# Patient Record
Sex: Female | Born: 1935 | Race: White | Hispanic: No | Smoking: Never smoker
Health system: Southern US, Community
[De-identification: ages and names within clinical notes are randomized; demographics above are authoritative.]

## PROBLEM LIST (undated history)

## (undated) DIAGNOSIS — K219 Gastro-esophageal reflux disease without esophagitis: Secondary | ICD-10-CM

## (undated) DIAGNOSIS — I509 Heart failure, unspecified: Secondary | ICD-10-CM

## (undated) DIAGNOSIS — Z8719 Personal history of other diseases of the digestive system: Secondary | ICD-10-CM

## (undated) DIAGNOSIS — C4490 Unspecified malignant neoplasm of skin, unspecified: Secondary | ICD-10-CM

## (undated) DIAGNOSIS — I10 Essential (primary) hypertension: Secondary | ICD-10-CM

## (undated) DIAGNOSIS — R011 Cardiac murmur, unspecified: Secondary | ICD-10-CM

## (undated) DIAGNOSIS — N184 Chronic kidney disease, stage 4 (severe): Secondary | ICD-10-CM

## (undated) DIAGNOSIS — M109 Gout, unspecified: Secondary | ICD-10-CM

## (undated) DIAGNOSIS — I4891 Unspecified atrial fibrillation: Secondary | ICD-10-CM

## (undated) DIAGNOSIS — E7800 Pure hypercholesterolemia, unspecified: Secondary | ICD-10-CM

## (undated) DIAGNOSIS — M1990 Unspecified osteoarthritis, unspecified site: Secondary | ICD-10-CM

## (undated) DIAGNOSIS — IMO0001 Reserved for inherently not codable concepts without codable children: Secondary | ICD-10-CM

## (undated) HISTORY — PX: BREAST LUMPECTOMY: SHX2

## (undated) HISTORY — DX: Unspecified atrial fibrillation: I48.91

## (undated) HISTORY — PX: TOTAL KNEE ARTHROPLASTY: SHX125

## (undated) HISTORY — PX: CATARACT EXTRACTION W/ INTRAOCULAR LENS  IMPLANT, BILATERAL: SHX1307

## (undated) HISTORY — DX: Essential (primary) hypertension: I10

## (undated) HISTORY — PX: JOINT REPLACEMENT: SHX530

---

## 1998-03-06 ENCOUNTER — Other Ambulatory Visit: Admission: RE | Admit: 1998-03-06 | Discharge: 1998-03-06 | Payer: Self-pay | Admitting: Internal Medicine

## 2000-01-21 ENCOUNTER — Encounter: Payer: Self-pay | Admitting: Family Medicine

## 2000-01-21 ENCOUNTER — Encounter: Admission: RE | Admit: 2000-01-21 | Discharge: 2000-01-21 | Payer: Self-pay | Admitting: Family Medicine

## 2001-04-14 ENCOUNTER — Encounter: Payer: Self-pay | Admitting: Family Medicine

## 2001-04-14 ENCOUNTER — Encounter: Admission: RE | Admit: 2001-04-14 | Discharge: 2001-04-14 | Payer: Self-pay | Admitting: Family Medicine

## 2003-11-12 ENCOUNTER — Encounter: Admission: RE | Admit: 2003-11-12 | Discharge: 2003-11-12 | Payer: Self-pay | Admitting: Cardiology

## 2003-12-11 ENCOUNTER — Ambulatory Visit (HOSPITAL_COMMUNITY): Admission: RE | Admit: 2003-12-11 | Discharge: 2003-12-11 | Payer: Self-pay | Admitting: Gastroenterology

## 2005-05-26 ENCOUNTER — Encounter
Admission: RE | Admit: 2005-05-26 | Discharge: 2005-08-24 | Payer: Self-pay | Admitting: Physical Medicine and Rehabilitation

## 2005-05-26 ENCOUNTER — Ambulatory Visit: Payer: Self-pay | Admitting: Physical Medicine and Rehabilitation

## 2005-05-27 ENCOUNTER — Ambulatory Visit (HOSPITAL_COMMUNITY)
Admission: RE | Admit: 2005-05-27 | Discharge: 2005-05-27 | Payer: Self-pay | Admitting: Physical Medicine and Rehabilitation

## 2005-06-08 ENCOUNTER — Encounter
Admission: RE | Admit: 2005-06-08 | Discharge: 2005-07-16 | Payer: Self-pay | Admitting: Physical Medicine and Rehabilitation

## 2005-07-02 ENCOUNTER — Encounter
Admission: RE | Admit: 2005-07-02 | Discharge: 2005-07-02 | Payer: Self-pay | Admitting: Physical Medicine and Rehabilitation

## 2005-07-21 ENCOUNTER — Ambulatory Visit: Payer: Self-pay | Admitting: Physical Medicine and Rehabilitation

## 2005-07-28 ENCOUNTER — Encounter (HOSPITAL_COMMUNITY): Admission: RE | Admit: 2005-07-28 | Discharge: 2005-10-26 | Payer: Self-pay | Admitting: Endocrinology

## 2005-09-01 ENCOUNTER — Encounter (HOSPITAL_COMMUNITY): Admission: RE | Admit: 2005-09-01 | Discharge: 2005-09-01 | Payer: Self-pay | Admitting: Endocrinology

## 2005-09-15 ENCOUNTER — Encounter
Admission: RE | Admit: 2005-09-15 | Discharge: 2005-12-14 | Payer: Self-pay | Admitting: Physical Medicine and Rehabilitation

## 2005-09-15 ENCOUNTER — Ambulatory Visit: Payer: Self-pay | Admitting: Physical Medicine and Rehabilitation

## 2006-01-21 ENCOUNTER — Ambulatory Visit: Payer: Self-pay | Admitting: Physical Medicine and Rehabilitation

## 2006-01-21 ENCOUNTER — Encounter
Admission: RE | Admit: 2006-01-21 | Discharge: 2006-04-21 | Payer: Self-pay | Admitting: Physical Medicine and Rehabilitation

## 2006-03-03 ENCOUNTER — Ambulatory Visit: Payer: Self-pay | Admitting: Physical Medicine and Rehabilitation

## 2006-04-13 ENCOUNTER — Ambulatory Visit: Payer: Self-pay | Admitting: Physical Medicine and Rehabilitation

## 2006-05-11 ENCOUNTER — Encounter
Admission: RE | Admit: 2006-05-11 | Discharge: 2006-08-09 | Payer: Self-pay | Admitting: Physical Medicine and Rehabilitation

## 2006-06-08 ENCOUNTER — Ambulatory Visit: Payer: Self-pay | Admitting: Physical Medicine and Rehabilitation

## 2006-08-03 ENCOUNTER — Ambulatory Visit: Payer: Self-pay | Admitting: Physical Medicine and Rehabilitation

## 2006-08-24 ENCOUNTER — Encounter
Admission: RE | Admit: 2006-08-24 | Discharge: 2006-11-22 | Payer: Self-pay | Admitting: Physical Medicine and Rehabilitation

## 2006-09-29 ENCOUNTER — Ambulatory Visit: Payer: Self-pay | Admitting: Physical Medicine and Rehabilitation

## 2006-12-01 ENCOUNTER — Ambulatory Visit: Payer: Self-pay | Admitting: Physical Medicine and Rehabilitation

## 2006-12-01 ENCOUNTER — Encounter
Admission: RE | Admit: 2006-12-01 | Discharge: 2007-03-01 | Payer: Self-pay | Admitting: Physical Medicine and Rehabilitation

## 2007-01-04 ENCOUNTER — Ambulatory Visit: Payer: Self-pay | Admitting: Physical Medicine and Rehabilitation

## 2007-02-03 ENCOUNTER — Ambulatory Visit: Payer: Self-pay | Admitting: Physical Medicine and Rehabilitation

## 2007-03-03 ENCOUNTER — Encounter
Admission: RE | Admit: 2007-03-03 | Discharge: 2007-06-01 | Payer: Self-pay | Admitting: Physical Medicine and Rehabilitation

## 2007-04-06 ENCOUNTER — Ambulatory Visit: Payer: Self-pay | Admitting: Physical Medicine and Rehabilitation

## 2007-05-05 ENCOUNTER — Encounter
Admission: RE | Admit: 2007-05-05 | Discharge: 2007-08-03 | Payer: Self-pay | Admitting: Physical Medicine and Rehabilitation

## 2007-06-02 ENCOUNTER — Ambulatory Visit: Payer: Self-pay | Admitting: Physical Medicine and Rehabilitation

## 2007-07-04 ENCOUNTER — Ambulatory Visit: Payer: Self-pay | Admitting: Physical Medicine and Rehabilitation

## 2007-07-19 IMAGING — CT CT CHEST WO
2 of 4 series · 16 of 31 positions shown, 18 images · non-contrast
Comparison: none

______________________________________________________________

REASON FOR CONSULTATION: R/O BLEED
____________________________________________
EXAM: HEAD WITHOUT CONTRAST
NONENHANCED HEAD CT:
No intra-axial or extra-axial pathologic fluid or blood collections are
identified. Infarction is noted in the LEFT basal ganglia and anterior
limb of the internal capsule. No evidence of intracranial hemorrhage.

[Series 2: chest wo · axial · 0.79mm/px · z∈[+1252,+1477]mm · 8 of 61 slices shown, 10 images]
[im 8/61  mediastinal]
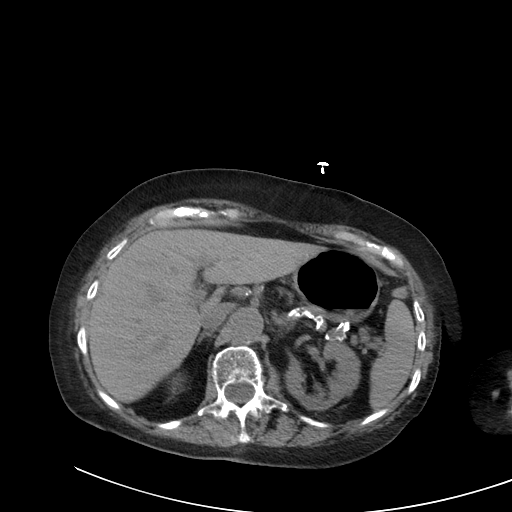
[im 8/61  lung]
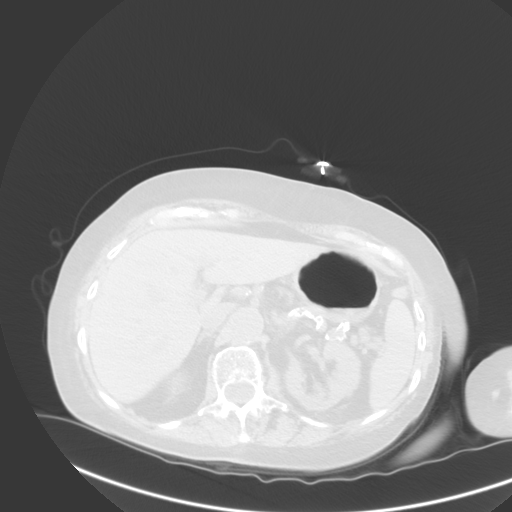
[im 16/61  lung]
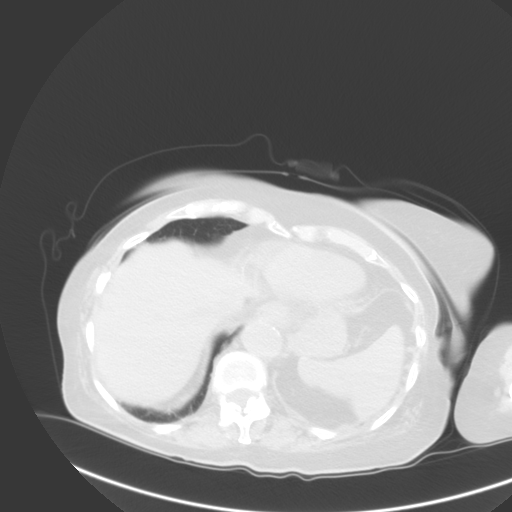
[im 23/61  lung]
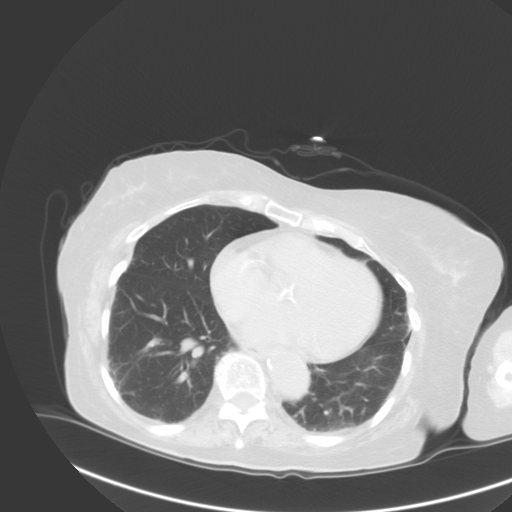
[im 29/61  lung]
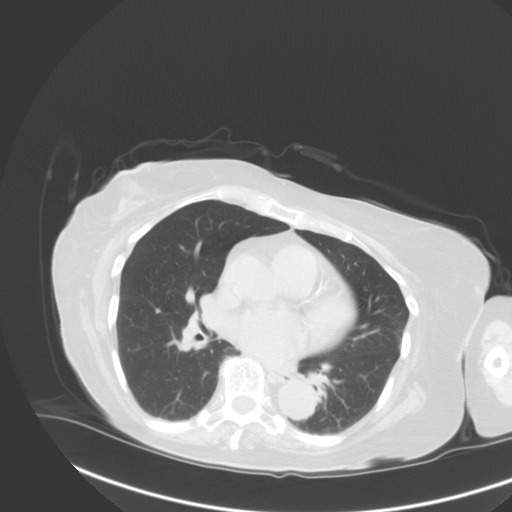
[im 31/61  mediastinal]
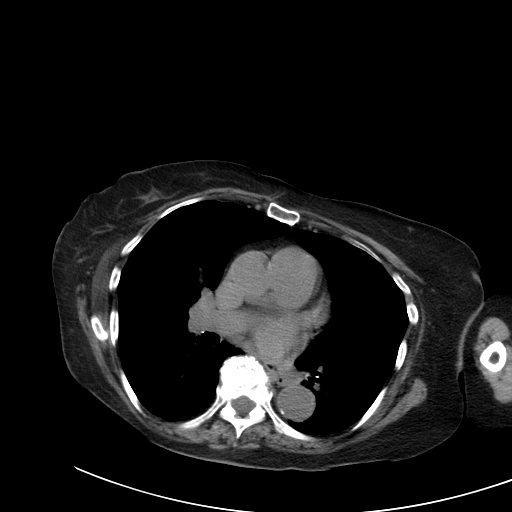
[im 31/61  lung]
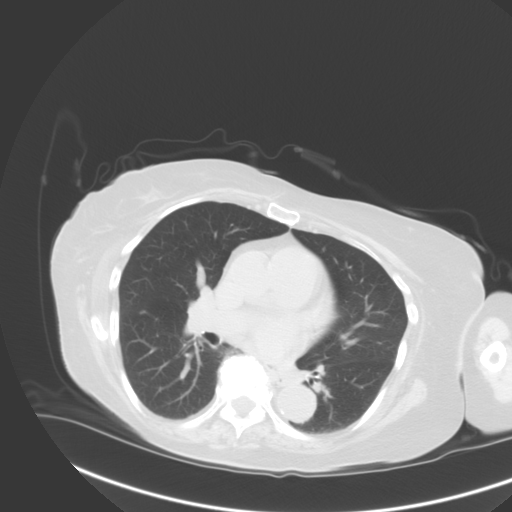
[im 38/61  lung]
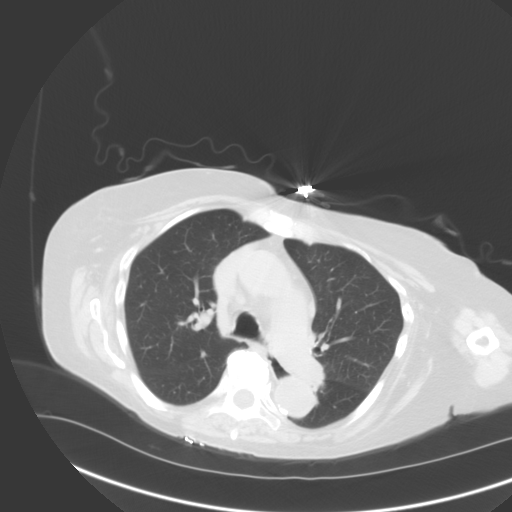
[im 46/61  lung]
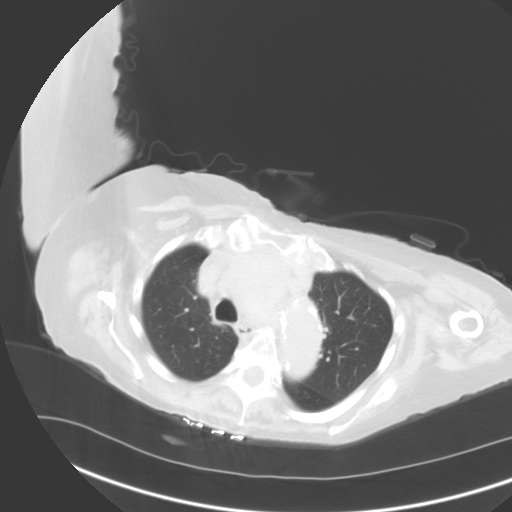
[im 53/61  lung]
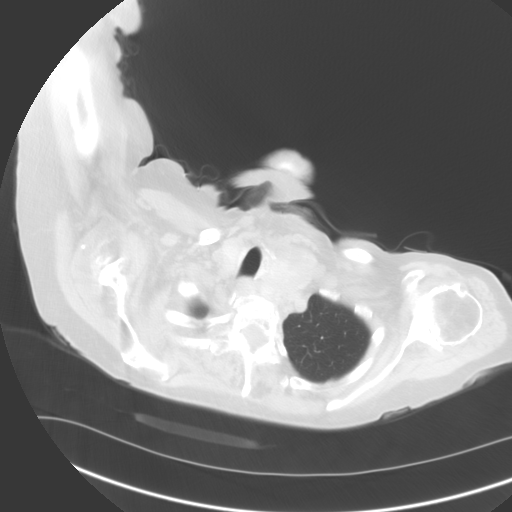

[Series 5: bone · axial · 0.79mm/px · z∈[+1252,+1477]mm · 8 of 61 slices shown]
[im 8/61  lung]
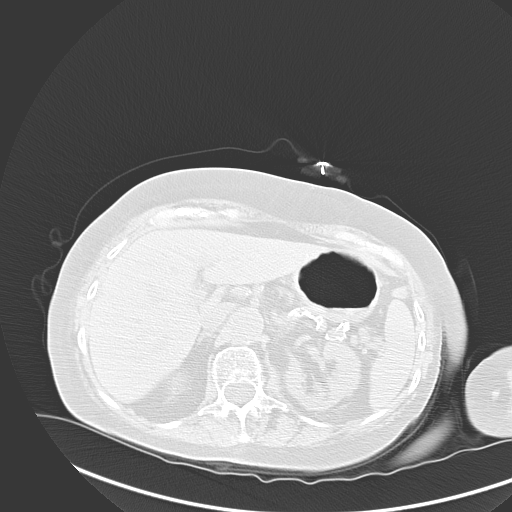
[im 16/61  lung]
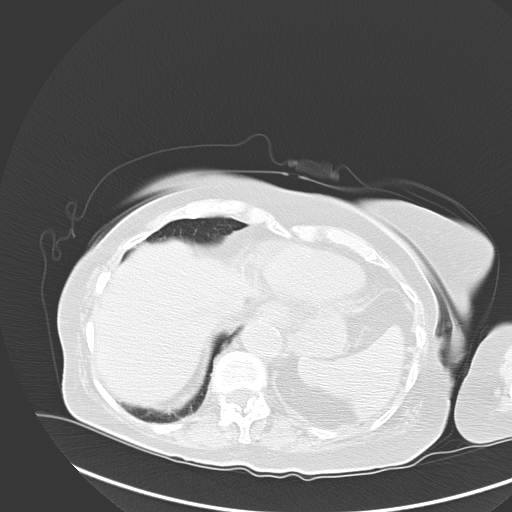
[im 23/61  lung]
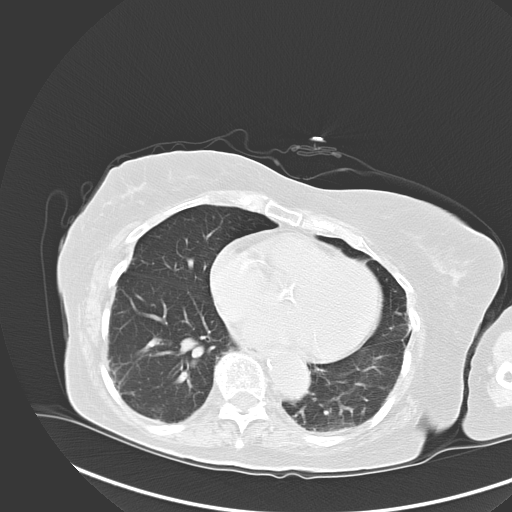
[im 29/61  lung]
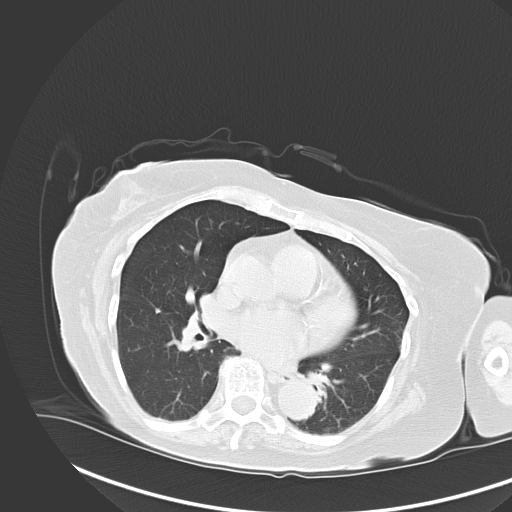
[im 31/61  lung]
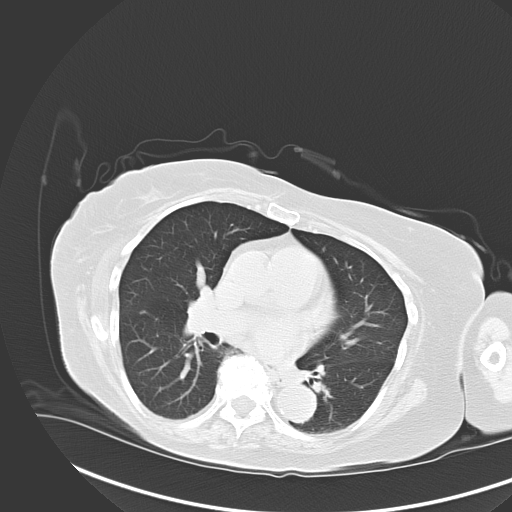
[im 38/61  lung]
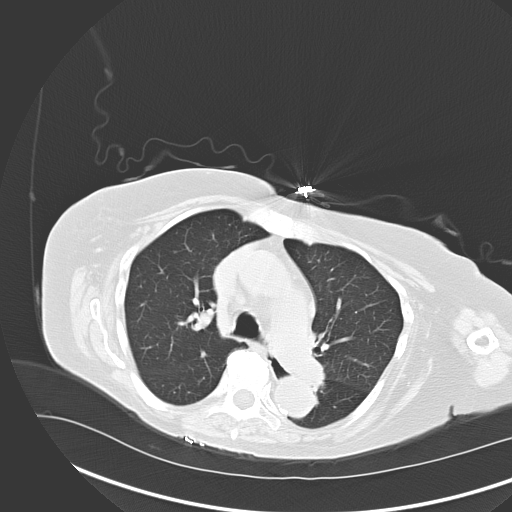
[im 46/61  lung]
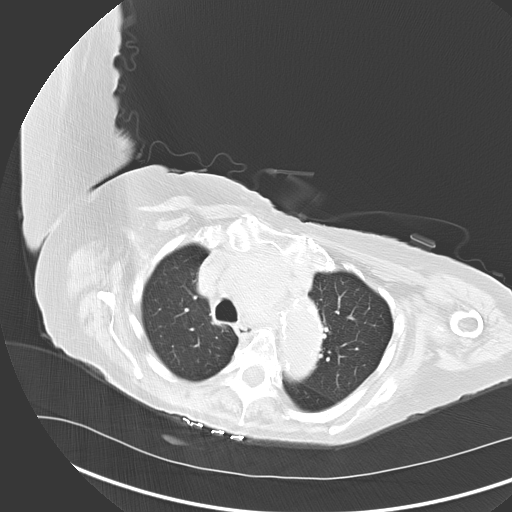
[im 53/61  lung]
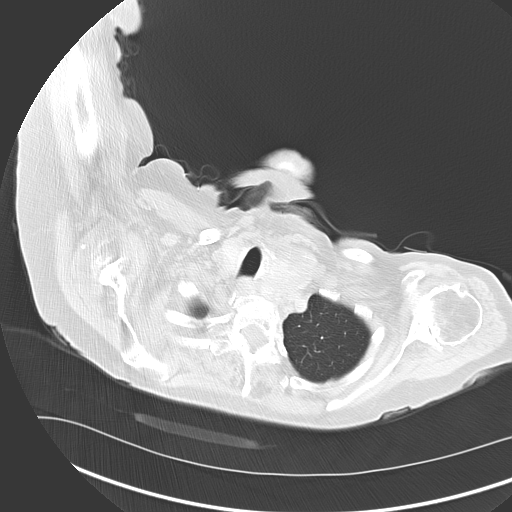

[16 of 31 positions shown; findings below may reference images not displayed]

IMPRESSION: Lacunar infarctions in the internal capsule and LEFT basal
ganglia. No evidence of acute hemorrhage.

## 2007-08-02 ENCOUNTER — Encounter
Admission: RE | Admit: 2007-08-02 | Discharge: 2007-10-31 | Payer: Self-pay | Admitting: Physical Medicine and Rehabilitation

## 2007-08-05 ENCOUNTER — Ambulatory Visit: Payer: Self-pay | Admitting: Physical Medicine and Rehabilitation

## 2007-08-29 ENCOUNTER — Ambulatory Visit: Payer: Self-pay | Admitting: Physical Medicine & Rehabilitation

## 2007-08-29 ENCOUNTER — Encounter
Admission: RE | Admit: 2007-08-29 | Discharge: 2007-08-29 | Payer: Self-pay | Admitting: Physical Medicine & Rehabilitation

## 2007-09-09 ENCOUNTER — Ambulatory Visit: Payer: Self-pay | Admitting: Physical Medicine and Rehabilitation

## 2007-10-10 ENCOUNTER — Ambulatory Visit: Payer: Self-pay | Admitting: Physical Medicine and Rehabilitation

## 2007-10-14 ENCOUNTER — Ambulatory Visit: Payer: Self-pay | Admitting: Physical Medicine and Rehabilitation

## 2007-10-21 ENCOUNTER — Ambulatory Visit: Payer: Self-pay | Admitting: Physical Medicine and Rehabilitation

## 2007-11-22 ENCOUNTER — Encounter
Admission: RE | Admit: 2007-11-22 | Discharge: 2008-02-20 | Payer: Self-pay | Admitting: Physical Medicine and Rehabilitation

## 2007-11-23 ENCOUNTER — Ambulatory Visit: Payer: Self-pay | Admitting: Physical Medicine and Rehabilitation

## 2007-12-07 ENCOUNTER — Ambulatory Visit: Payer: Self-pay | Admitting: Physical Medicine and Rehabilitation

## 2007-12-21 ENCOUNTER — Ambulatory Visit: Payer: Self-pay | Admitting: Physical Medicine and Rehabilitation

## 2008-01-18 ENCOUNTER — Ambulatory Visit: Payer: Self-pay | Admitting: Physical Medicine and Rehabilitation

## 2008-02-15 ENCOUNTER — Ambulatory Visit: Payer: Self-pay | Admitting: Physical Medicine and Rehabilitation

## 2008-02-17 ENCOUNTER — Ambulatory Visit (HOSPITAL_COMMUNITY)
Admission: RE | Admit: 2008-02-17 | Discharge: 2008-02-17 | Payer: Self-pay | Admitting: Physical Medicine and Rehabilitation

## 2008-03-05 ENCOUNTER — Encounter
Admission: RE | Admit: 2008-03-05 | Discharge: 2008-06-03 | Payer: Self-pay | Admitting: Physical Medicine and Rehabilitation

## 2008-03-07 ENCOUNTER — Ambulatory Visit: Payer: Self-pay | Admitting: Physical Medicine and Rehabilitation

## 2008-04-11 ENCOUNTER — Ambulatory Visit: Payer: Self-pay | Admitting: Physical Medicine and Rehabilitation

## 2008-05-21 ENCOUNTER — Ambulatory Visit: Payer: Self-pay | Admitting: Physical Medicine and Rehabilitation

## 2008-06-14 ENCOUNTER — Encounter
Admission: RE | Admit: 2008-06-14 | Discharge: 2008-09-12 | Payer: Self-pay | Admitting: Physical Medicine and Rehabilitation

## 2008-06-18 ENCOUNTER — Ambulatory Visit: Payer: Self-pay | Admitting: Physical Medicine and Rehabilitation

## 2008-06-19 ENCOUNTER — Ambulatory Visit (HOSPITAL_COMMUNITY)
Admission: RE | Admit: 2008-06-19 | Discharge: 2008-06-19 | Payer: Self-pay | Admitting: Physical Medicine and Rehabilitation

## 2008-07-20 ENCOUNTER — Ambulatory Visit: Payer: Self-pay | Admitting: Physical Medicine and Rehabilitation

## 2008-08-24 ENCOUNTER — Ambulatory Visit: Payer: Self-pay | Admitting: Physical Medicine and Rehabilitation

## 2008-09-06 ENCOUNTER — Encounter
Admission: RE | Admit: 2008-09-06 | Discharge: 2008-12-05 | Payer: Self-pay | Admitting: Physical Medicine and Rehabilitation

## 2008-09-21 ENCOUNTER — Ambulatory Visit: Payer: Self-pay | Admitting: Physical Medicine and Rehabilitation

## 2008-10-22 ENCOUNTER — Ambulatory Visit: Payer: Self-pay | Admitting: Physical Medicine and Rehabilitation

## 2008-11-30 ENCOUNTER — Ambulatory Visit: Payer: Self-pay | Admitting: Physical Medicine and Rehabilitation

## 2008-12-26 ENCOUNTER — Encounter
Admission: RE | Admit: 2008-12-26 | Discharge: 2009-03-21 | Payer: Self-pay | Admitting: Physical Medicine and Rehabilitation

## 2008-12-28 ENCOUNTER — Ambulatory Visit: Payer: Self-pay | Admitting: Physical Medicine and Rehabilitation

## 2009-01-25 ENCOUNTER — Ambulatory Visit: Payer: Self-pay | Admitting: Physical Medicine and Rehabilitation

## 2009-02-25 ENCOUNTER — Ambulatory Visit: Payer: Self-pay | Admitting: Physical Medicine and Rehabilitation

## 2009-04-03 ENCOUNTER — Encounter
Admission: RE | Admit: 2009-04-03 | Discharge: 2009-07-02 | Payer: Self-pay | Admitting: Physical Medicine and Rehabilitation

## 2009-04-05 ENCOUNTER — Ambulatory Visit: Payer: Self-pay | Admitting: Physical Medicine and Rehabilitation

## 2009-05-03 ENCOUNTER — Ambulatory Visit: Payer: Self-pay | Admitting: Physical Medicine and Rehabilitation

## 2009-05-29 ENCOUNTER — Ambulatory Visit: Payer: Self-pay | Admitting: Physical Medicine and Rehabilitation

## 2009-06-26 ENCOUNTER — Ambulatory Visit: Payer: Self-pay | Admitting: Physical Medicine and Rehabilitation

## 2009-07-05 ENCOUNTER — Emergency Department (HOSPITAL_COMMUNITY): Admission: EM | Admit: 2009-07-05 | Discharge: 2009-07-05 | Payer: Self-pay | Admitting: Emergency Medicine

## 2009-07-29 ENCOUNTER — Encounter
Admission: RE | Admit: 2009-07-29 | Discharge: 2009-10-27 | Payer: Self-pay | Source: Home / Self Care | Admitting: Physical Medicine and Rehabilitation

## 2009-07-31 ENCOUNTER — Ambulatory Visit: Payer: Self-pay | Admitting: Physical Medicine and Rehabilitation

## 2009-09-04 ENCOUNTER — Ambulatory Visit: Payer: Self-pay | Admitting: Physical Medicine and Rehabilitation

## 2009-10-14 ENCOUNTER — Ambulatory Visit: Payer: Self-pay | Admitting: Physical Medicine and Rehabilitation

## 2009-11-20 ENCOUNTER — Encounter
Admission: RE | Admit: 2009-11-20 | Discharge: 2010-02-18 | Payer: Self-pay | Admitting: Physical Medicine and Rehabilitation

## 2009-11-27 ENCOUNTER — Ambulatory Visit: Payer: Self-pay | Admitting: Physical Medicine and Rehabilitation

## 2009-12-30 ENCOUNTER — Ambulatory Visit: Payer: Self-pay | Admitting: Physical Medicine and Rehabilitation

## 2010-01-29 ENCOUNTER — Ambulatory Visit: Payer: Self-pay | Admitting: Physical Medicine and Rehabilitation

## 2010-02-19 ENCOUNTER — Encounter
Admission: RE | Admit: 2010-02-19 | Discharge: 2010-03-26 | Payer: Self-pay | Source: Home / Self Care | Attending: Physical Medicine and Rehabilitation | Admitting: Physical Medicine and Rehabilitation

## 2010-02-26 ENCOUNTER — Ambulatory Visit: Payer: Self-pay | Admitting: Physical Medicine and Rehabilitation

## 2010-03-21 IMAGING — CR DG HIP W/ PELVIS BILAT
5 series · 5 of 5 positions shown · non-contrast
Comparison: None.

CLINICAL DATA: Fell.  Bilateral hip pain.

BILATERAL HIP WITH PELVIS - 4+ VIEW 02/17/2008:

[t pelvis a.p.]
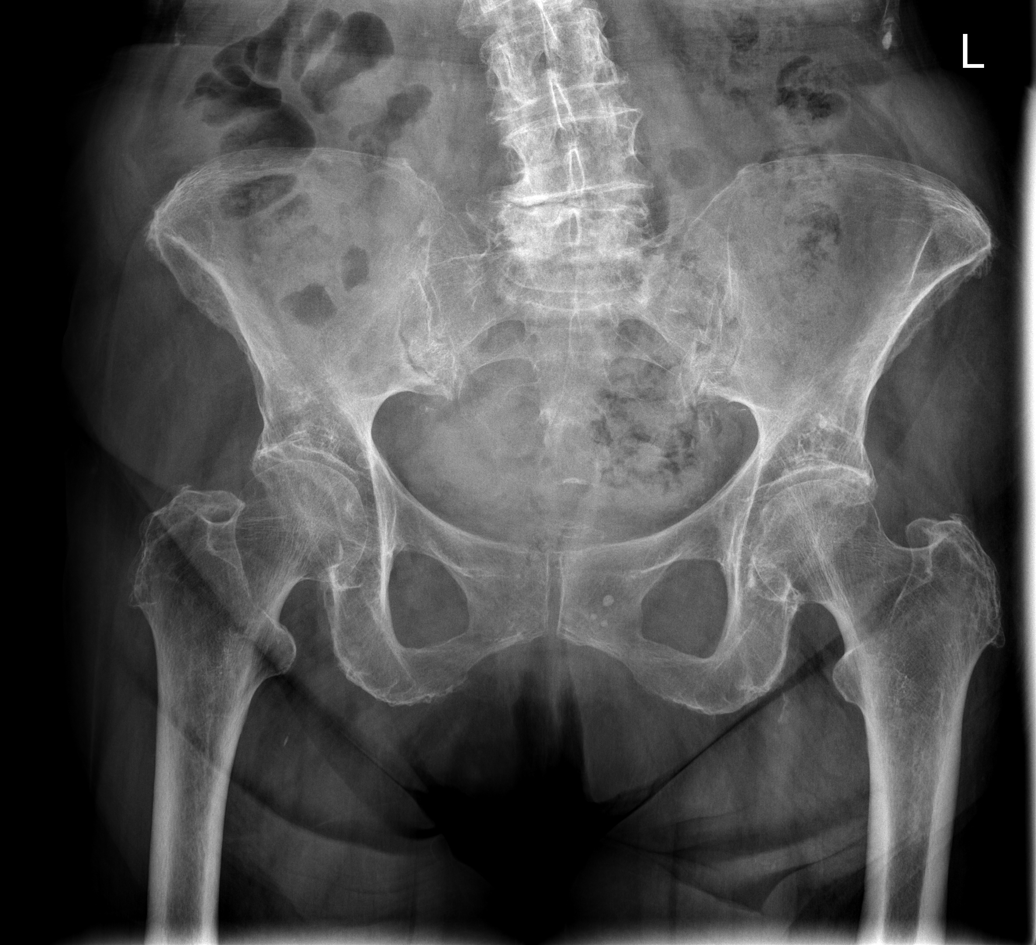

[t hip ap left]
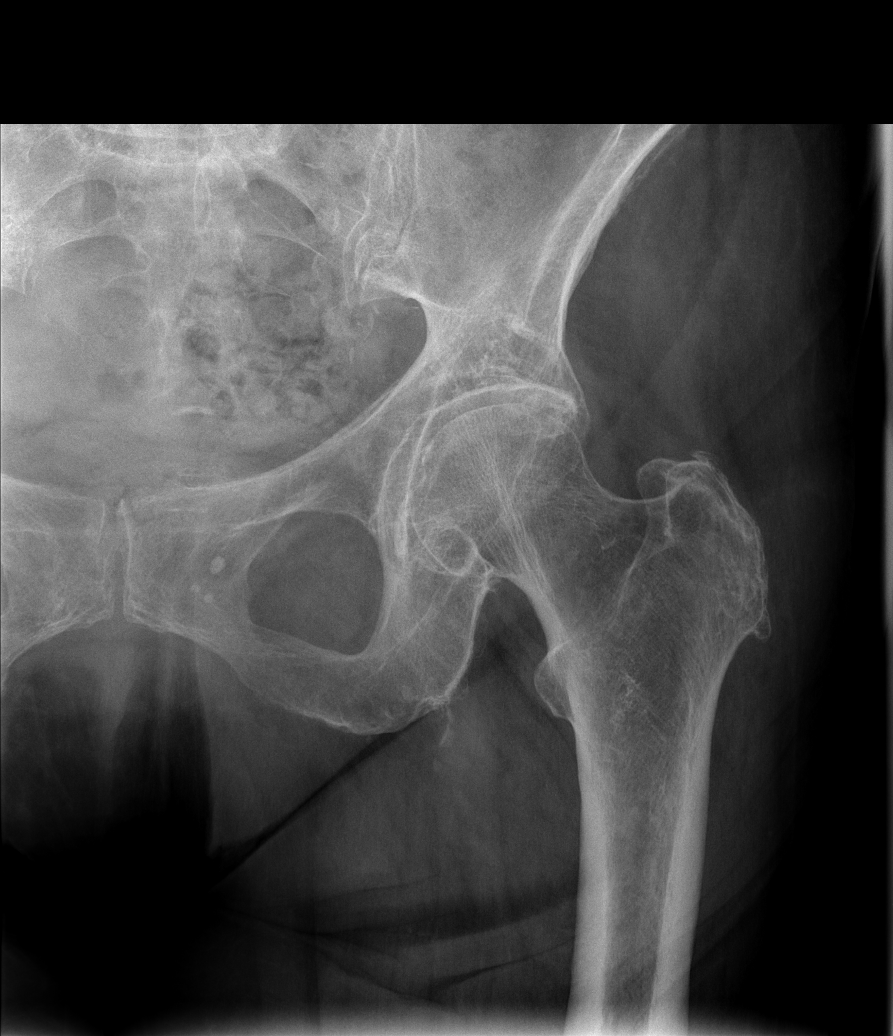

[t hip ap right]
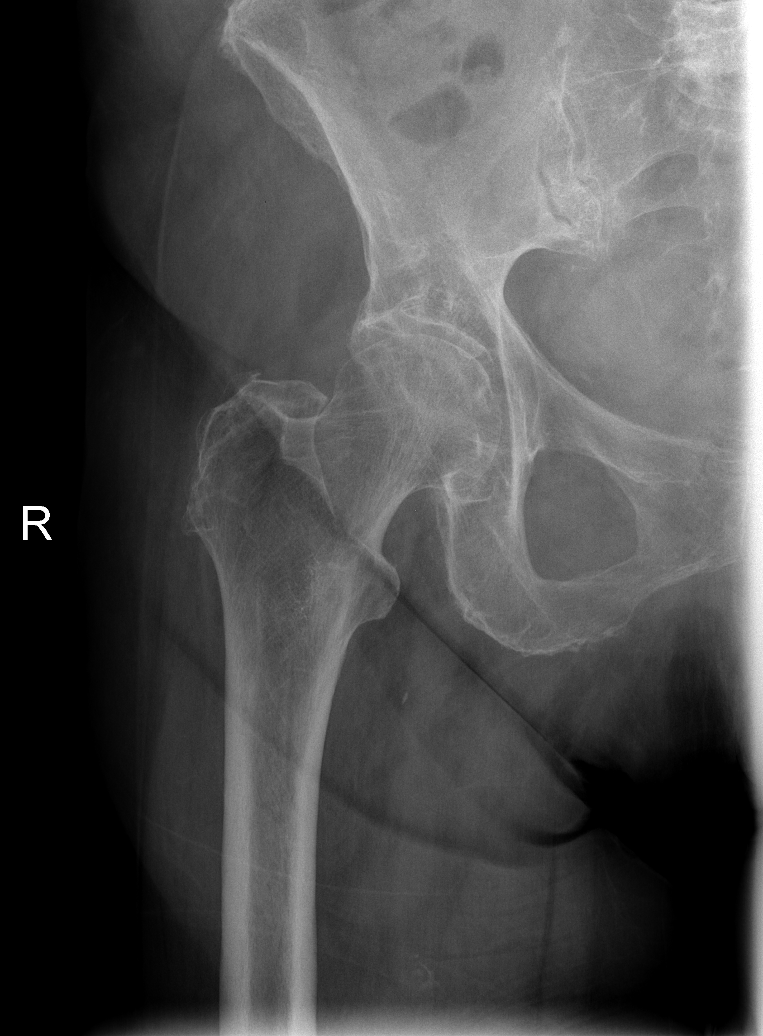

[t hip frog leg left]
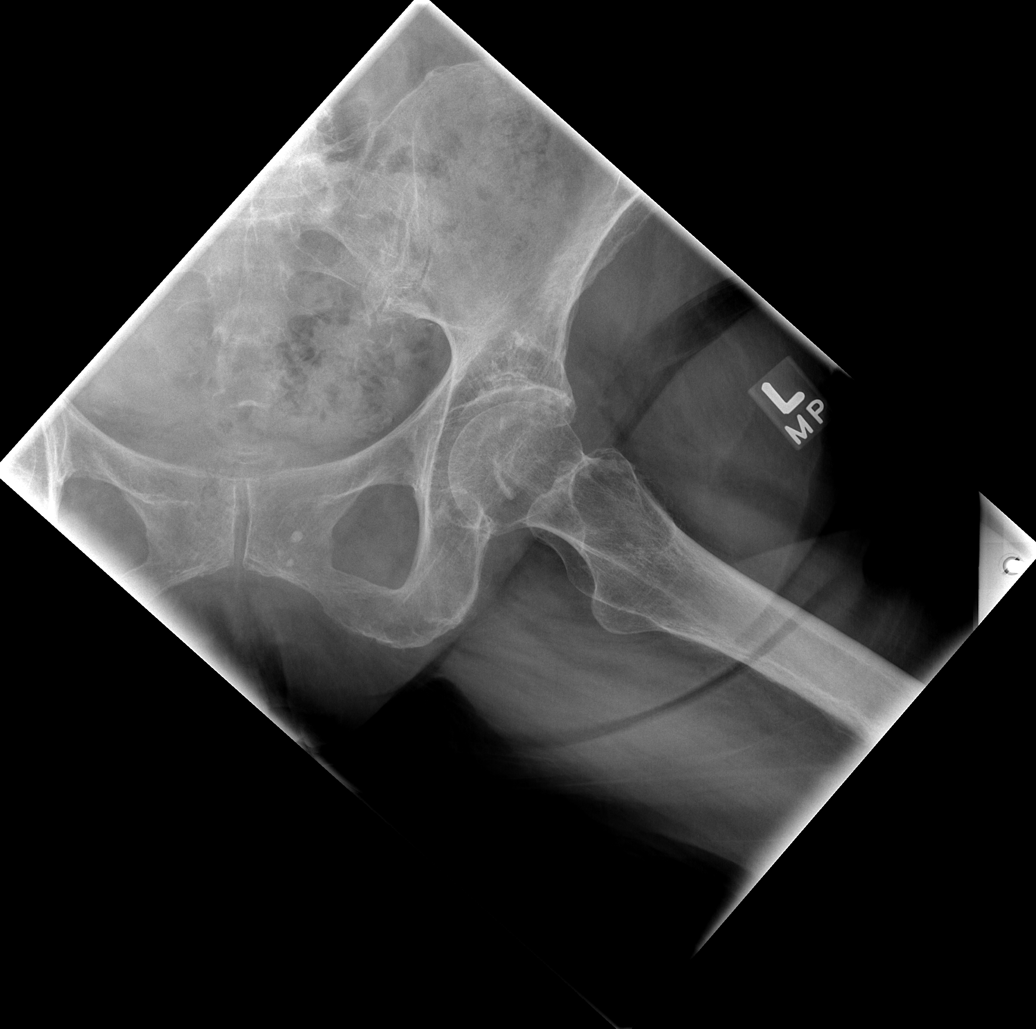

[t hip frog leg right]
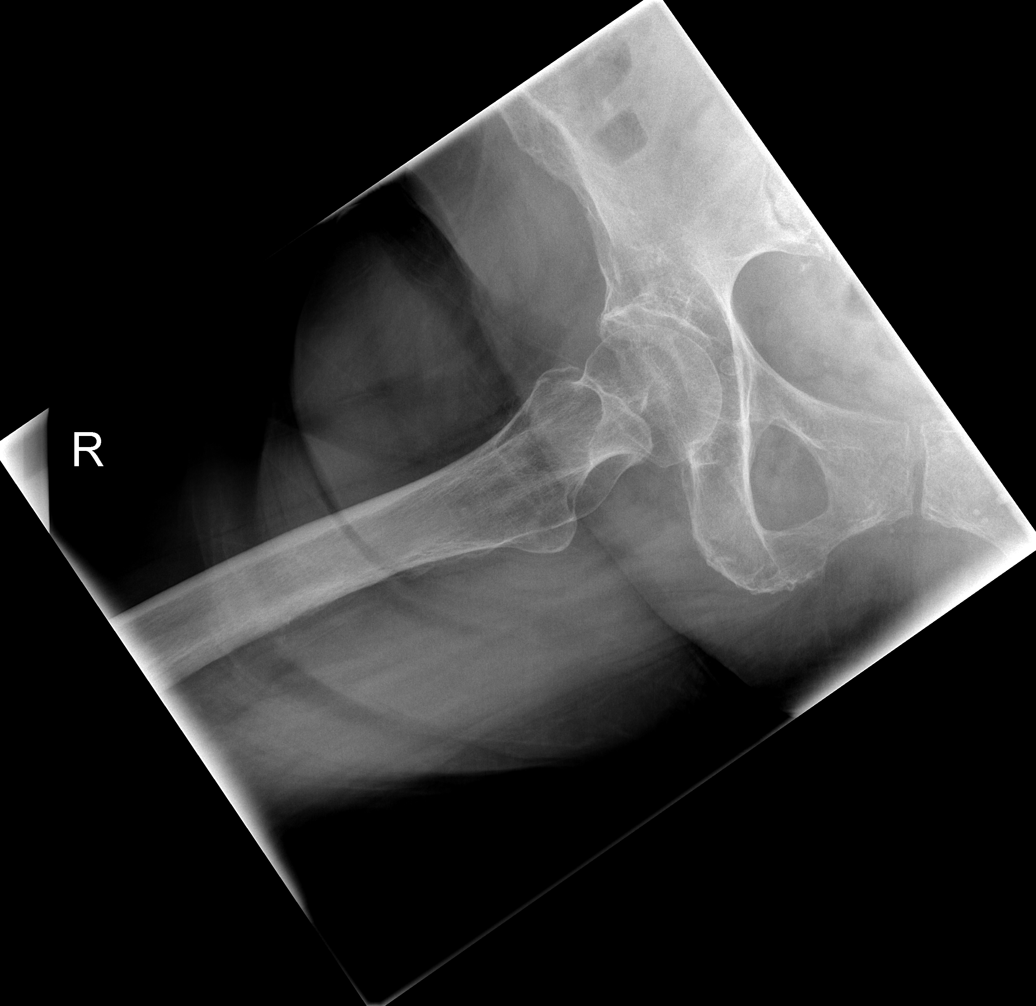

[5 of 5 positions shown; findings below may reference images not displayed]

FINDINGS: Mild osteopenia.  No evidence of acute fracture or
dislocation involving either hip.  Joint spaces well preserved for
age.  Mild enthesopathy at the musculotendinous insertions on the
greater trochanters bilaterally.  No significant intrinsic osseous
abnormality involving either hip.

Included AP pelvis demonstrating degenerative changes in the
sacroiliac joints and visualized lower lumbar spine.  Symphysis
pubis intact.
IMPRESSION: 1.  No acute skeletal abnormalities.
2.  Well-preserved joint spaces in both hips.
3.  Degenerative changes in the sacroiliac joints and visualized
lower lumbar spine.
4.  Mild osteopenia.

## 2010-03-31 ENCOUNTER — Encounter
Admission: RE | Admit: 2010-03-31 | Discharge: 2010-04-01 | Payer: Self-pay | Source: Home / Self Care | Attending: Physical Medicine and Rehabilitation | Admitting: Physical Medicine and Rehabilitation

## 2010-04-01 ENCOUNTER — Ambulatory Visit
Admission: RE | Admit: 2010-04-01 | Discharge: 2010-04-01 | Payer: Self-pay | Source: Home / Self Care | Attending: Physical Medicine and Rehabilitation | Admitting: Physical Medicine and Rehabilitation

## 2010-05-05 ENCOUNTER — Ambulatory Visit: Payer: Medicare Other | Admitting: Physical Medicine and Rehabilitation

## 2010-05-05 ENCOUNTER — Ambulatory Visit: Payer: Medicare Other | Attending: Physical Medicine and Rehabilitation

## 2010-05-05 DIAGNOSIS — M47812 Spondylosis without myelopathy or radiculopathy, cervical region: Secondary | ICD-10-CM | POA: Insufficient documentation

## 2010-05-05 DIAGNOSIS — M542 Cervicalgia: Secondary | ICD-10-CM

## 2010-05-05 DIAGNOSIS — M1710 Unilateral primary osteoarthritis, unspecified knee: Secondary | ICD-10-CM

## 2010-05-05 DIAGNOSIS — G8929 Other chronic pain: Secondary | ICD-10-CM | POA: Insufficient documentation

## 2010-05-05 DIAGNOSIS — M159 Polyosteoarthritis, unspecified: Secondary | ICD-10-CM | POA: Insufficient documentation

## 2010-05-05 DIAGNOSIS — M545 Low back pain, unspecified: Secondary | ICD-10-CM

## 2010-05-05 DIAGNOSIS — M48061 Spinal stenosis, lumbar region without neurogenic claudication: Secondary | ICD-10-CM | POA: Insufficient documentation

## 2010-05-05 DIAGNOSIS — M109 Gout, unspecified: Secondary | ICD-10-CM | POA: Insufficient documentation

## 2010-05-05 DIAGNOSIS — M19029 Primary osteoarthritis, unspecified elbow: Secondary | ICD-10-CM

## 2010-05-05 DIAGNOSIS — Z79899 Other long term (current) drug therapy: Secondary | ICD-10-CM | POA: Insufficient documentation

## 2010-05-05 DIAGNOSIS — M47817 Spondylosis without myelopathy or radiculopathy, lumbosacral region: Secondary | ICD-10-CM | POA: Insufficient documentation

## 2010-05-05 DIAGNOSIS — Z96659 Presence of unspecified artificial knee joint: Secondary | ICD-10-CM | POA: Insufficient documentation

## 2010-06-03 ENCOUNTER — Inpatient Hospital Stay (HOSPITAL_COMMUNITY)
Admission: EM | Admit: 2010-06-03 | Discharge: 2010-06-10 | DRG: 292 | Disposition: A | Discharge status: Home Health | Payer: Medicare Other | Attending: Cardiology | Admitting: Cardiology

## 2010-06-03 ENCOUNTER — Emergency Department (HOSPITAL_COMMUNITY): Payer: Medicare Other

## 2010-06-03 ENCOUNTER — Encounter: Payer: Medicare Other | Attending: Physical Medicine and Rehabilitation

## 2010-06-03 ENCOUNTER — Ambulatory Visit: Payer: Medicare Other

## 2010-06-03 DIAGNOSIS — M19019 Primary osteoarthritis, unspecified shoulder: Secondary | ICD-10-CM | POA: Insufficient documentation

## 2010-06-03 DIAGNOSIS — R1310 Dysphagia, unspecified: Secondary | ICD-10-CM | POA: Diagnosis present

## 2010-06-03 DIAGNOSIS — Z96659 Presence of unspecified artificial knee joint: Secondary | ICD-10-CM

## 2010-06-03 DIAGNOSIS — M51379 Other intervertebral disc degeneration, lumbosacral region without mention of lumbar back pain or lower extremity pain: Secondary | ICD-10-CM | POA: Diagnosis present

## 2010-06-03 DIAGNOSIS — M5030 Other cervical disc degeneration, unspecified cervical region: Secondary | ICD-10-CM | POA: Diagnosis present

## 2010-06-03 DIAGNOSIS — M545 Low back pain, unspecified: Secondary | ICD-10-CM | POA: Insufficient documentation

## 2010-06-03 DIAGNOSIS — I4891 Unspecified atrial fibrillation: Secondary | ICD-10-CM | POA: Diagnosis present

## 2010-06-03 DIAGNOSIS — M109 Gout, unspecified: Secondary | ICD-10-CM | POA: Insufficient documentation

## 2010-06-03 DIAGNOSIS — M25519 Pain in unspecified shoulder: Secondary | ICD-10-CM | POA: Insufficient documentation

## 2010-06-03 DIAGNOSIS — I110 Hypertensive heart disease with heart failure: Principal | ICD-10-CM | POA: Diagnosis present

## 2010-06-03 DIAGNOSIS — M5137 Other intervertebral disc degeneration, lumbosacral region: Secondary | ICD-10-CM | POA: Diagnosis present

## 2010-06-03 DIAGNOSIS — E860 Dehydration: Secondary | ICD-10-CM | POA: Diagnosis present

## 2010-06-03 DIAGNOSIS — M47812 Spondylosis without myelopathy or radiculopathy, cervical region: Secondary | ICD-10-CM | POA: Insufficient documentation

## 2010-06-03 DIAGNOSIS — K228 Other specified diseases of esophagus: Secondary | ICD-10-CM | POA: Diagnosis present

## 2010-06-03 DIAGNOSIS — K2289 Other specified disease of esophagus: Secondary | ICD-10-CM | POA: Diagnosis present

## 2010-06-03 DIAGNOSIS — M542 Cervicalgia: Secondary | ICD-10-CM

## 2010-06-03 DIAGNOSIS — N179 Acute kidney failure, unspecified: Secondary | ICD-10-CM | POA: Diagnosis present

## 2010-06-03 DIAGNOSIS — M1710 Unilateral primary osteoarthritis, unspecified knee: Secondary | ICD-10-CM | POA: Insufficient documentation

## 2010-06-03 DIAGNOSIS — G894 Chronic pain syndrome: Secondary | ICD-10-CM | POA: Diagnosis present

## 2010-06-03 DIAGNOSIS — G8929 Other chronic pain: Secondary | ICD-10-CM | POA: Insufficient documentation

## 2010-06-03 DIAGNOSIS — I5033 Acute on chronic diastolic (congestive) heart failure: Secondary | ICD-10-CM | POA: Diagnosis present

## 2010-06-03 DIAGNOSIS — M47817 Spondylosis without myelopathy or radiculopathy, lumbosacral region: Secondary | ICD-10-CM | POA: Insufficient documentation

## 2010-06-03 DIAGNOSIS — I43 Cardiomyopathy in diseases classified elsewhere: Secondary | ICD-10-CM | POA: Diagnosis present

## 2010-06-03 DIAGNOSIS — Z79899 Other long term (current) drug therapy: Secondary | ICD-10-CM | POA: Insufficient documentation

## 2010-06-03 DIAGNOSIS — M25529 Pain in unspecified elbow: Secondary | ICD-10-CM

## 2010-06-03 DIAGNOSIS — M25569 Pain in unspecified knee: Secondary | ICD-10-CM | POA: Insufficient documentation

## 2010-06-03 LAB — CBC
Hemoglobin: 10.7 g/dL — ABNORMAL LOW (ref 12.0–15.0)
MCH: 29.5 pg (ref 26.0–34.0)
MCHC: 32.7 g/dL (ref 30.0–36.0)
Platelets: 180 10*3/uL (ref 150–400)
WBC: 4.5 10*3/uL (ref 4.0–10.5)

## 2010-06-03 LAB — POCT CARDIAC MARKERS
CKMB, poc: 1.1 ng/mL (ref 1.0–8.0)
Myoglobin, poc: 135 ng/mL (ref 12–200)
Troponin i, poc: <0.05 ng/mL (ref 0.00–0.09)

## 2010-06-03 LAB — POCT I-STAT, CHEM 8
BUN: 26 mg/dL — ABNORMAL HIGH (ref 6–23)
Calcium, Ion: 1.21 mmol/L (ref 1.12–1.32)
Chloride: 99 meq/L (ref 96–112)
Creatinine, Ser: 1.3 mg/dL — ABNORMAL HIGH (ref 0.4–1.2)
Glucose, Bld: 93 mg/dL (ref 70–99)
HCT: 34 % — ABNORMAL LOW (ref 36.0–46.0)
Hemoglobin: 11.6 g/dL — ABNORMAL LOW (ref 12.0–15.0)
Potassium: 3.5 meq/L (ref 3.5–5.1)
Sodium: 137 meq/L (ref 135–145)
TCO2: 28 mmol/L (ref 0–100)

## 2010-06-03 LAB — COMPREHENSIVE METABOLIC PANEL
Alkaline Phosphatase: 48 U/L (ref 39–117)
BUN: 24 mg/dL — ABNORMAL HIGH (ref 6–23)
Calcium: 9.5 mg/dL (ref 8.4–10.5)
Creatinine, Ser: 1.1 mg/dL (ref 0.4–1.2)
Glucose, Bld: 93 mg/dL (ref 70–99)
Total Protein: 6 g/dL (ref 6.0–8.3)

## 2010-06-03 LAB — DIFFERENTIAL
Basophils Absolute: 0 10*3/uL (ref 0.0–0.1)
Monocytes Relative: 14 % — ABNORMAL HIGH (ref 3–12)

## 2010-06-03 LAB — PROTIME-INR: INR: 1.11 (ref 0.00–1.49)

## 2010-06-03 LAB — APTT: aPTT: 30 seconds (ref 24–37)

## 2010-06-04 LAB — BASIC METABOLIC PANEL
BUN: 23 mg/dL (ref 6–23)
GFR calc non Af Amer: 48 mL/min — ABNORMAL LOW (ref >60)
Glucose, Bld: 160 mg/dL — ABNORMAL HIGH (ref 70–99)
Potassium: 3.7 mEq/L (ref 3.5–5.1)
Sodium: 139 mEq/L (ref 135–145)

## 2010-06-04 LAB — PROTIME-INR
INR: 1 (ref 0.00–1.49)
Prothrombin Time: 13.4 seconds (ref 11.6–15.2)

## 2010-06-05 DIAGNOSIS — M7989 Other specified soft tissue disorders: Secondary | ICD-10-CM

## 2010-06-05 LAB — BASIC METABOLIC PANEL
CO2: 30 mEq/L (ref 19–32)
Calcium: 9.8 mg/dL (ref 8.4–10.5)
GFR calc non Af Amer: 37 mL/min — ABNORMAL LOW (ref >60)
Sodium: 136 mEq/L (ref 135–145)

## 2010-06-05 LAB — BRAIN NATRIURETIC PEPTIDE: Pro B Natriuretic peptide (BNP): 574 pg/mL — ABNORMAL HIGH (ref 0.0–100.0)

## 2010-06-06 ENCOUNTER — Inpatient Hospital Stay (HOSPITAL_COMMUNITY): Payer: Medicare Other

## 2010-06-06 LAB — PROTIME-INR: Prothrombin Time: 17.7 seconds — ABNORMAL HIGH (ref 11.6–15.2)

## 2010-06-06 LAB — BASIC METABOLIC PANEL
CO2: 32 mEq/L (ref 19–32)
Calcium: 9.4 mg/dL (ref 8.4–10.5)
Chloride: 96 mEq/L (ref 96–112)
GFR calc Af Amer: 40 mL/min — ABNORMAL LOW (ref >60)
Potassium: 3 mEq/L — ABNORMAL LOW (ref 3.5–5.1)
Sodium: 136 mEq/L (ref 135–145)

## 2010-06-07 ENCOUNTER — Other Ambulatory Visit: Payer: Self-pay | Admitting: Gastroenterology

## 2010-06-07 DIAGNOSIS — I4891 Unspecified atrial fibrillation: Secondary | ICD-10-CM

## 2010-06-07 LAB — BASIC METABOLIC PANEL
Calcium: 9.4 mg/dL (ref 8.4–10.5)
Chloride: 96 mEq/L (ref 96–112)
Creatinine, Ser: 1.79 mg/dL — ABNORMAL HIGH (ref 0.4–1.2)
GFR calc Af Amer: 32 mL/min — ABNORMAL LOW (ref >60)

## 2010-06-07 LAB — BRAIN NATRIURETIC PEPTIDE: Pro B Natriuretic peptide (BNP): 397 pg/mL — ABNORMAL HIGH (ref 0.0–100.0)

## 2010-06-07 LAB — PROTIME-INR: INR: 2.01 — ABNORMAL HIGH (ref 0.00–1.49)

## 2010-06-08 LAB — CBC
HCT: 35.1 % — ABNORMAL LOW (ref 36.0–46.0)
Platelets: 205 10*3/uL (ref 150–400)
RDW: 14.9 % (ref 11.5–15.5)
WBC: 4.7 10*3/uL (ref 4.0–10.5)

## 2010-06-08 LAB — PROTIME-INR
INR: 2.69 — ABNORMAL HIGH (ref 0.00–1.49)
Prothrombin Time: 28.7 seconds — ABNORMAL HIGH (ref 11.6–15.2)

## 2010-06-08 LAB — BASIC METABOLIC PANEL
Calcium: 9.5 mg/dL (ref 8.4–10.5)
Creatinine, Ser: 2 mg/dL — ABNORMAL HIGH (ref 0.4–1.2)
GFR calc Af Amer: 28 mL/min — ABNORMAL LOW (ref >60)

## 2010-06-09 LAB — BASIC METABOLIC PANEL
Calcium: 9 mg/dL (ref 8.4–10.5)
Creatinine, Ser: 2.44 mg/dL — ABNORMAL HIGH (ref 0.4–1.2)
GFR calc Af Amer: 23 mL/min — ABNORMAL LOW (ref >60)
GFR calc non Af Amer: 19 mL/min — ABNORMAL LOW (ref >60)
Glucose, Bld: 99 mg/dL (ref 70–99)
Sodium: 137 mEq/L (ref 135–145)

## 2010-06-09 LAB — CBC
Platelets: 213 10*3/uL (ref 150–400)
RBC: 3.74 MIL/uL — ABNORMAL LOW (ref 3.87–5.11)
WBC: 4.6 10*3/uL (ref 4.0–10.5)

## 2010-06-09 LAB — PROTIME-INR: Prothrombin Time: 31.5 seconds — ABNORMAL HIGH (ref 11.6–15.2)

## 2010-06-10 LAB — BASIC METABOLIC PANEL
BUN: 47 mg/dL — ABNORMAL HIGH (ref 6–23)
Calcium: 9.1 mg/dL (ref 8.4–10.5)
GFR calc non Af Amer: 26 mL/min — ABNORMAL LOW (ref >60)
Potassium: 4 mEq/L (ref 3.5–5.1)
Sodium: 136 mEq/L (ref 135–145)

## 2010-06-10 LAB — PROTIME-INR: Prothrombin Time: 25.3 seconds — ABNORMAL HIGH (ref 11.6–15.2)

## 2010-06-16 NOTE — H&P (Signed)
NAME:  [name], [name]                 ACCOUNT NO.:  [id number]  MEDICAL RECORD NO.:  [id number]           PATIENT TYPE:  I  LOCATION:  4714                         FACILITY:  [facility]  PHYSICIAN:  Georga Hacking, M.D.DATE OF BIRTH:  1917/07/17  DATE OF ADMISSION:  06/03/2010                             HISTORY & PHYSICAL   REASON FOR ADMISSION:  Edema and atrial fibrillation.  HISTORY:  The patient is a 77 year old female who I have seen previously who has a history of hypertensive heart disease and dyspnea.  She has had 2 previous negative Cardiolite through the years and an echocardiogram just showing hypertension.  She has had a 3-week history of significant edema and [facility] she has been followed with pain and was thought to have gout involving her left hand.  She was noted to be in atrial fibrillation at the [facility] and was sent here by ambulance.  She was feeling bad today and complained of dyspnea but has not had any chest pain.  She has had edema for about 3 weeks.  She has been taking an unknown medicine for gout.  She denies PND or orthopnea at the present time and has moderate dyspnea with exertion.  She has no chest pain suggestive of angina.  PAST MEDICAL HISTORY:  Remarkable for hypertension, hyperlipidemia, obesity, previous history of gout, and she has also had significant osteoarthritis.  She has a history of extensive diverticulosis.  She has been mildly obese.  PAST SURGICAL HISTORY: 1. Breast lumpectomy. 2. Left knee replacement.  ALLERGIES:  None known.  MEDICATIONS PRIOR TO ADMISSION: 1. Uloric 40 mg daily. 2. Systane 1 drop as needed. 3. Colchicine 0.6 b.i.d. 4. Hydrochlorothiazide 25 mg daily. 5. Norvasc 5 mg daily. 6. Fish oil 850 mg daily. 7. Senna and docusate 1 tablet daily. 8. TriCor 145 mg daily. 9. Percocet q.3 h. as needed. 10.Lisinopril 20 mg daily.  FAMILY HISTORY:  Father died in his 48s of  tuberculosis and diabetes. Mother died in her 67s of a fractured hip.  Brother and sister healthy.  SOCIAL HISTORY:  She is a retired [employer].  She has one daughter.  She attends [employer].  She is a widow.  Her husband died about 4 years ago of mesothelioma.  She is a nonsmoker.  Does not use alcohol to excess.  REVIEW OF SYSTEMS:  Obese for several years.  No skin problems.  She has had a previous lens implant.  She has occasional symptoms of reflux and she will take Pepcid for it.  She denies syncope or claudication.  No history of genitourinary symptoms.  Severe history of arthritis involving her hands, knees, and has had significant low-back pain.  She has no history of stroke or TIA.  No history of GI bleeding.  Other than as noted above, the remainder of the review of systems is unremarkable.  PHYSICAL EXAMINATION:  GENERAL:  She is an obese pleasant female currently in no acute distress. VITAL SIGNS:  Blood pressure is currently 150/90, pulse is 108 irregularly irregular. SKIN:  Warm and dry.  She has occasional scattered ecchymoses noted. ENT:  EOMI.  PERRLA.  C and S clear.  Fundi mild arteriolar narrowing. Tympanic membranes normal.  Pharynx is negative, NECK:  Supple without masses.  There is no JVD noted. LUNGS:  Clear. BREASTS:  Not examined. CARDIAC:  Irregular rhythm.  Normal S1-S2.  No S3.  There is a 1/6 systolic murmur at the aortic area. ABDOMEN:  Obese and soft.  There is no aneurysm.  No mass, hepatosplenomegaly, or aneurysm. EXTREMITIES:  Femoral and distal pulses are 2+.  There is 3+ peripheral edema with mild T-wave changes noted with mild venous varicosities noted.  RADIOLOGIC DATA:  EKG shows atrial fibrillation with a mildly rapid ventricular response.  LABORATORY DATA:  Hemoglobin of 11.8 and hematocrit of 34.0.  D-dimer is 0.87.  Sodium is 137, potassium 3.5, chloride 99, BUN 26, and creatinine 1.3.  BNP is  pending at the time of dictation.  Initial cardiac enzymes were normal.  Portable chest x-ray shows no congestive heart failure with cardiac enlargement but she does have cardiac enlargement.  IMPRESSION: 1. Marked bilateral peripheral edema, could be venous insufficiency,     could be congestive heart failure. 2. Atrial fibrillation, uncontrolled. 3. Hypertensive heart disease. 4. Previous gout. 5. Severe osteoarthritis with cervical lumbar disk disease with     previous epidural steroid injections and chronic pain syndrome. 6. History of thyroid trouble with previous history of radioactive     iodine therapy for hypothyroidism in 2007.  RECOMMENDATIONS:  Admit to telemetry, begin beta-blocker, check echocardiogram, check TSH, evaluate for hypothyroidism, and check lower extremities for venous insufficiency.    Georga Hacking, M.D.    WST/MEDQ  D:  06/03/2010  T:  06/04/2010  Job:  [id number]  cc:   Marjory Lies, M.D.  Electronically Signed by Lacretia Nicks. Donnie Aho M.D. on 06/16/2010 05:02:58 PM

## 2010-06-19 ENCOUNTER — Inpatient Hospital Stay (HOSPITAL_COMMUNITY)
Admission: EM | Admit: 2010-06-19 | Discharge: 2010-06-21 | DRG: 641 | Disposition: A | Discharge status: Home Health | Payer: Medicare Other | Attending: Internal Medicine | Admitting: Internal Medicine

## 2010-06-19 ENCOUNTER — Emergency Department (HOSPITAL_COMMUNITY): Payer: Medicare Other

## 2010-06-19 DIAGNOSIS — I4891 Unspecified atrial fibrillation: Secondary | ICD-10-CM | POA: Diagnosis present

## 2010-06-19 DIAGNOSIS — I129 Hypertensive chronic kidney disease with stage 1 through stage 4 chronic kidney disease, or unspecified chronic kidney disease: Secondary | ICD-10-CM | POA: Diagnosis present

## 2010-06-19 DIAGNOSIS — Z7901 Long term (current) use of anticoagulants: Secondary | ICD-10-CM

## 2010-06-19 DIAGNOSIS — I509 Heart failure, unspecified: Secondary | ICD-10-CM | POA: Diagnosis present

## 2010-06-19 DIAGNOSIS — T502X5A Adverse effect of carbonic-anhydrase inhibitors, benzothiadiazides and other diuretics, initial encounter: Secondary | ICD-10-CM | POA: Diagnosis present

## 2010-06-19 DIAGNOSIS — N183 Chronic kidney disease, stage 3 unspecified: Secondary | ICD-10-CM | POA: Diagnosis present

## 2010-06-19 DIAGNOSIS — I5032 Chronic diastolic (congestive) heart failure: Secondary | ICD-10-CM | POA: Diagnosis present

## 2010-06-19 DIAGNOSIS — N289 Disorder of kidney and ureter, unspecified: Secondary | ICD-10-CM | POA: Diagnosis present

## 2010-06-19 DIAGNOSIS — E871 Hypo-osmolality and hyponatremia: Principal | ICD-10-CM | POA: Diagnosis present

## 2010-06-19 DIAGNOSIS — Y92009 Unspecified place in unspecified non-institutional (private) residence as the place of occurrence of the external cause: Secondary | ICD-10-CM

## 2010-06-19 DIAGNOSIS — E7800 Pure hypercholesterolemia, unspecified: Secondary | ICD-10-CM | POA: Diagnosis present

## 2010-06-19 LAB — URINALYSIS, ROUTINE W REFLEX MICROSCOPIC
Bilirubin Urine: NEGATIVE
Hgb urine dipstick: NEGATIVE
Ketones, ur: NEGATIVE mg/dL
Nitrite: NEGATIVE
Protein, ur: NEGATIVE mg/dL
Urobilinogen, UA: 0.2 mg/dL (ref 0.0–1.0)

## 2010-06-19 LAB — DIFFERENTIAL
Basophils Absolute: 0 10*3/uL (ref 0.0–0.1)
Basophils Relative: 0 % (ref 0–1)
Eosinophils Absolute: 0.1 10*3/uL (ref 0.0–0.7)
Monocytes Absolute: 0.4 10*3/uL (ref 0.1–1.0)
Neutro Abs: 3.5 10*3/uL (ref 1.7–7.7)

## 2010-06-19 LAB — APTT: aPTT: 32 seconds (ref 24–37)

## 2010-06-19 LAB — CBC
Hemoglobin: 11.1 g/dL — ABNORMAL LOW (ref 12.0–15.0)
MCH: 29.2 pg (ref 26.0–34.0)
MCHC: 34.7 g/dL (ref 30.0–36.0)
Platelets: 192 10*3/uL (ref 150–400)
RDW: 14.1 % (ref 11.5–15.5)

## 2010-06-19 LAB — PROTIME-INR
INR: 1.95 — ABNORMAL HIGH (ref 0.00–1.49)
Prothrombin Time: 22.4 seconds — ABNORMAL HIGH (ref 11.6–15.2)

## 2010-06-20 LAB — POCT I-STAT, CHEM 8
Creatinine, Ser: 1.9 mg/dL — ABNORMAL HIGH (ref 0.4–1.2)
HCT: 34 % — ABNORMAL LOW (ref 36.0–46.0)
Hemoglobin: 11.6 g/dL — ABNORMAL LOW (ref 12.0–15.0)
Potassium: 4.7 mEq/L (ref 3.5–5.1)
Sodium: 125 mEq/L — ABNORMAL LOW (ref 135–145)
TCO2: 23 mmol/L (ref 0–100)

## 2010-06-20 LAB — BASIC METABOLIC PANEL
CO2: 30 mEq/L (ref 19–32)
Calcium: 9.4 mg/dL (ref 8.4–10.5)
Chloride: 98 mEq/L (ref 96–112)
Creatinine, Ser: 1.57 mg/dL — ABNORMAL HIGH (ref 0.4–1.2)
GFR calc Af Amer: 37 mL/min — ABNORMAL LOW (ref >60)
Glucose, Bld: 94 mg/dL (ref 70–99)
Sodium: 131 mEq/L — ABNORMAL LOW (ref 135–145)

## 2010-06-20 LAB — CBC
HCT: 27.4 % — ABNORMAL LOW (ref 36.0–46.0)
MCH: 28.4 pg (ref 26.0–34.0)
MCV: 83.8 fL (ref 78.0–100.0)
Platelets: 149 10*3/uL — ABNORMAL LOW (ref 150–400)
RDW: 14.3 % (ref 11.5–15.5)

## 2010-06-21 LAB — BASIC METABOLIC PANEL
BUN: 40 mg/dL — ABNORMAL HIGH (ref 6–23)
Chloride: 95 mEq/L — ABNORMAL LOW (ref 96–112)
GFR calc non Af Amer: 30 mL/min — ABNORMAL LOW (ref >60)
Potassium: 4.1 mEq/L (ref 3.5–5.1)
Sodium: 132 mEq/L — ABNORMAL LOW (ref 135–145)

## 2010-06-21 LAB — PROTIME-INR
INR: 1.74 — ABNORMAL HIGH (ref 0.00–1.49)
Prothrombin Time: 20.5 seconds — ABNORMAL HIGH (ref 11.6–15.2)

## 2010-06-24 NOTE — Discharge Summary (Signed)
NAME:  [name], [name]                 ACCOUNT NO.:  [id number]  MEDICAL RECORD NO.:  [id number]           PATIENT TYPE:  I  LOCATION:  4707                         FACILITY:  [facility]  PHYSICIAN:  Mauro Kaufmann, MD         DATE OF BIRTH:  23-Oct-1917  DATE OF ADMISSION:  06/19/2010 DATE OF DISCHARGE:  06/21/2010                              DISCHARGE SUMMARY   ADMISSION DIAGNOSES: 1. Generalized weakness. 2. Abnormal labs. 3. Hyponatremia. 4. History of congestive heart failure, diastolic dysfunction. 5. History of chronic kidney disease, stable. 6. Hypertension. 7. Atrial fibrillation with controlled rate.  DISCHARGE DIAGNOSES: 1. Hyponatremia, resolved. 2. The patient is off hydrochlorothiazide, which is the causative     factor for hyponatremia. 3. History of diastolic heart failure, stable. 4. Chronic kidney disease stage III, stable. 5. Hypertension. 6. Atrial fibrillation with rate controlled.  LABORATORY DATA:  Tests performed during the hospital stay include pelvic x-ray showed degenerative changes and osteopenia, no acute finding by plain radiography.  Chest x-ray on May 30, 2010, showed no acute or posttraumatic deformity, cardiomegaly with ectasia of the thoracic aorta with great vessels.  Possible thickening along the bilateral costophrenic angles.  PERTINENT LABS:  On the day of discharge, the patient's sodium 132, potassium is 4.1, chloride 95, CO2 of 32, BUN 40, creatinine 1.61, glucose is 104.  INR is 1.74 (the patient will be getting 2 tablets of Coumadin instead of one and then she can start taking 1 tablets as scheduled by her primary care physician).  BRIEF HISTORY AND PHYSICAL:  A 77 year old woman who lives alone with multiple comorbidities, was previously discharged from the hospital on June 10, 2010 when she was hospitalized for acute diastolic heart failure and discharged home.  The patient went to see her PCP.  She had a followup blood test done and  she was called home today for low sodium of 119 and was asked to come to the hospital.  The patient also has generalized weakness and had 2 falls recently.  Denies any dizziness or confusion.  The patient usually is able to handle herself well.  She is able to perform the ADLs by herself.  Daughter lives close by.  The patient was admitted with a diagnosis of hyponatremia and generalized weakness.  The patient was on both hydrochlorothiazide as well as Lasix and hydrochlorothiazide and Lasix  were discontinued.  BRIEF HOSPITAL COURSE: 1. Hyponatremia.  The patient's hyponatremia is most likely secondary     to hydrochlorothiazide, so hydrochlorothiazide has been     discontinued.  Her sodium level has improved.  The patient was     given Lasix instead of hydrochlorothiazide for diastolic     dysfunction and her sodium level has remained stable.  At this     time, our recommendation will be to discontinue     hydrochlorothiazide. 2. Diastolic heart failure.  The patient is currently on Lasix and     will be discharged on Lasix and continue taking Coreg.  The patient     can follow up with her doctor daily as outpatient. 3.  Atrial fibrillation.  The patient's heart rate is controlled.     Currently, she is on Coreg as well as Cardizem.  The patient also     on Coumadin.  Her INR is 1.74.  The patient takes 2 mg of Coumadin.     Has been advised to take 2 tablets of Coumadin today and then start     taking as previously scheduled.  The patient can go to Dr.     Leretha Pol office on Monday to get her PT/INR checked. 4. Generalized weakness.  PT/OT evaluation was done and they     recommended the patient to have 24-hour supervision after     discharge.  The patient's daughter can stay with the patient and     also we are going to have home health RN to do a safety evaluation. 5. Chronic kidney disease.  The patient's creatinine is stable at this     time and she will follow with her primary  care physician.  I will     not start the patient on any potassium supplementation because of     underlying renal insufficiency as well as the patient is also on     lisinopril at this time.  The patient can continue to take Lasix     p.o. daily and should have a repeat BMET checked at the primary     care office in 2 weeks.  The patient will follow up with Dr. Donnie Aho     in 1 to 2 weeks and follow up with Dr. Doristine Counter, the patient's     primary care physician in 1 to 2 weeks.     Mauro Kaufmann, MD     GL/MEDQ  D:  06/21/2010  T:  06/21/2010  Job:  [id number]  cc:   Marjory Lies, M.D. Georga Hacking, M.D. Lyn Records, M.D.  Electronically Signed by Mauro Kaufmann  on 06/24/2010 10:04:33 AM

## 2010-06-29 NOTE — H&P (Signed)
NAME:  Rhonda, Dunn NO.:  [id number]  MEDICAL RECORD NO.:  [id number]           PATIENT TYPE:  E  LOCATION:  [facility]                         FACILITY:  [facility]  PHYSICIAN:  Baltazar Najjar, MD     DATE OF BIRTH:  March 26, 1918  DATE OF ADMISSION:  06/19/2010 DATE OF DISCHARGE:                             HISTORY & PHYSICAL   PRIMARY CARE PHYSICIAN:  Marjory Lies, MD  CODE STATUS:  DNI.  REASON FOR ADMISSION:  Generalized weakness/falls/abnormal labs.  HISTORY OF PRESENT ILLNESS:  [name] is a 77 year old woman who lives alone with multiple comorbidities, was recently discharged from the hospital back on March 13 when she was hospitalized for acute diastolic heart failure and discharged home.  She went to see her PCP and had followup blood test done and she was called at home today for a low sodium level of 119 and was asked to come to the hospital.  As per the patient, she was not feeling right today, she was feeling very weak, and she actually fell in her kitchen yesterday and she had 2 falls recently.  Denies any dizziness or confusion.  She just states her legs gave away, other than that she is very alert, oriented x3, actually very sharp for her age.  As I mentioned, she lives alone.  She is very independent and able to do all her or her ADLs, mostly alone and with the help of her daughter who lives close by.  The patient denies any chest pain or shortness of breath. She stated that she has been drinking more fluid lately because of the hot weather and she noticed that her legs are swollen, so she was seen by her medical doctor and her Lasix dose was adjusted yesterday.  This patient denies any other symptoms such as cough, abdominal pain, nausea, vomiting, or diarrhea.  In the ED, her repeat sodium level was found to be 125, however, we were asked to see her for admission for generalized weakness and falls and hyponatremia.  PAST MEDICAL  HISTORY: 1. History of diastolic heart failure. 2. Atrial fibrillation. 3. Hypertension. 4. History of dysphagia status post EGD that showed distal erosive     esophagitis. 5. History of gout. 6. Osteoarthritis. 7. History of diverticulosis.  PAST SURGICAL HISTORY:  Breast lumpectomy and left knee replacement.  ALLERGIES:  No known drug allergies.  HOME MEDICATIONS:  Medication reconciliation form still pending. However as per her recent discharge summary from June 10, 2010 she was discharged on: 1. Coreg 6.25 mg b.i.d. 2. Diltiazem CD 125 mg daily. 3. Protonix 40 mg p.o. b.i.d. 4. Coumadin 2 mg tablet one each evening to start on the 14th. 5. Colchicine 0.6 mg b.i.d. 6. Fish oil 1 tablet daily. 7. Hydrochlorothiazide 25 mg daily to start on March 16. 8. Lisinopril 20 mg daily. 9. Percocet 5/325 mg q.3 h. p.r.n. 10.Senna and docusate 4 tablets at bedtime. 11.Systane eyedrops. 12.TriCor 145 mg daily. 13.Uloric 40 mg daily.  SOCIAL HISTORY:  Lives alone, her daughter lives close by, is very independent.  No history of smoking, drinking, or  illicit drug use.  REVIEW OF SYSTEMS:  As above in HPI.  FAMILY HISTORY:  Noncontributory.  PHYSICAL EXAM:  VITAL SIGNS:  Blood pressure 145/109, pulse rate of 70, temperature 98.3, O2 sat 98% on room air. GENERAL:  She is alert, not in distress, oriented x3, elderly woman, looks younger than her stated age. NECK:  Supple.  No JVD. LUNGS:  Clear to auscultation bilaterally.  No rales or wheezing appreciated. CARDIOVASCULAR:  S1, S2 irregularly irregular rhythm. ABDOMEN:  Soft, nontender. EXTREMITIES:  Pedal edema 2+. NEUROLOGIC:  The patient moves all her extremities.  No focal deficit appreciated.  UA unremarkable.  PT 22.4, INR 1.95, WBCs 4.6, hemoglobin 11.1, hematocrit 32, platelet count 192. Radiology, pelvis x-ray showed degenerative changes and osteopenia, no acute finding.  ASSESSMENT AND PLAN: 1. Hyponatremia. 2.  Recurrent falls possibly secondary to hyponatremia. 3. History of congestive heart failure, diastolic 4. CKD stage III stable. 5. Hypertension, uncontrolled. 6. Atrial fibrillation with controlled rate.  PLAN: 1. The patient will be admitted to telemetry floor. 2. Her hyponatremia is multifactorial.  The patient has history of     CHF.  As per her, she was drinking excessive fluid lately and she     was also on hydrochlorothiazide.  We will discontinue     hydrochlorothiazide, continue with Lasix, and I will restrict her     fluid intake to less than 1 liter a day.  Monitor sodium level     closely with a goal of not more than 10 mEq of sodium rise within     24 hours.  Noted that her hyponatremia is acute.  Her previous     sodium levels in the chart were within normal level.  With her age     we should correct it slowly given the fact that she is currently     having mild symptoms and her mental status is within normal range. Urine sodium will not be helpful in this case given the fact that she is already on diuretics.  Her urine osmolality is most likely low and I will check her urine osmolality.  Her TSH level was checked recently and within normal range. 1. Continue her home antihypertensive regimen and we will adjust the     dose for optimal control.  We will hold HCTZ as above. 2. I will ask for PT/OT consult; however, I discussed placement issue     with her and she is not interested.  She will probably benefit from     physical therapy at home if she is refusing placement. 3. DVT prophylaxis.  The patient is on Coumadin.  We will ask pharmacy     to dose and adjust.  Her INR is currently close to therapeutic     range however subtherapeutic. 4. GI prophylaxis.  The patient is on Protonix to continue b.i.d.     given the fact that she has erosive esophagitis 5. Code status discussed with the patient who is alert, oriented x3.     She stated that she is okay with CPR and chest  compression;     however, she does not want to be intubated.          ______________________________ Baltazar Najjar, MD     SA/MEDQ  D:  06/19/2010  T:  06/19/2010  Job:  [id number]  cc:   Marjory Lies, M.D. Georga Hacking, M.D.  Electronically Signed by Hannah Beat MD on 06/29/2010 08:43:29 PM

## 2010-07-01 ENCOUNTER — Encounter: Payer: Medicare Other | Attending: Physical Medicine and Rehabilitation

## 2010-07-01 DIAGNOSIS — Z79899 Other long term (current) drug therapy: Secondary | ICD-10-CM | POA: Insufficient documentation

## 2010-07-01 DIAGNOSIS — M1710 Unilateral primary osteoarthritis, unspecified knee: Secondary | ICD-10-CM

## 2010-07-01 DIAGNOSIS — M19019 Primary osteoarthritis, unspecified shoulder: Secondary | ICD-10-CM | POA: Insufficient documentation

## 2010-07-01 DIAGNOSIS — M47812 Spondylosis without myelopathy or radiculopathy, cervical region: Secondary | ICD-10-CM | POA: Insufficient documentation

## 2010-07-01 DIAGNOSIS — Z862 Personal history of diseases of the blood and blood-forming organs and certain disorders involving the immune mechanism: Secondary | ICD-10-CM | POA: Insufficient documentation

## 2010-07-01 DIAGNOSIS — Z8639 Personal history of other endocrine, nutritional and metabolic disease: Secondary | ICD-10-CM | POA: Insufficient documentation

## 2010-07-01 DIAGNOSIS — M25569 Pain in unspecified knee: Secondary | ICD-10-CM | POA: Insufficient documentation

## 2010-07-01 DIAGNOSIS — M542 Cervicalgia: Secondary | ICD-10-CM

## 2010-07-01 DIAGNOSIS — M25519 Pain in unspecified shoulder: Secondary | ICD-10-CM | POA: Insufficient documentation

## 2010-07-01 DIAGNOSIS — M539 Dorsopathy, unspecified: Secondary | ICD-10-CM | POA: Insufficient documentation

## 2010-07-01 DIAGNOSIS — Z96659 Presence of unspecified artificial knee joint: Secondary | ICD-10-CM | POA: Insufficient documentation

## 2010-07-01 DIAGNOSIS — M545 Low back pain, unspecified: Secondary | ICD-10-CM

## 2010-07-01 DIAGNOSIS — M25529 Pain in unspecified elbow: Secondary | ICD-10-CM

## 2010-07-15 NOTE — Consult Note (Signed)
[name], [name] NO.:  [id number]  MEDICAL RECORD NO.:  [id number]           PATIENT TYPE:  LOCATION:                                 FACILITY:  PHYSICIAN:  Bernette Redbird, M.D.   DATE OF BIRTH:  [birth year]  DATE OF CONSULTATION:  06/06/2010 DATE OF DISCHARGE:                                CONSULTATION   Dr. Donnie Aho asked me to see this 77 year old female because of dysphagia.  The patient indicates that she has a fairly long history of occasional sensation of food sticking in her stomach after eating, but not really of esophageal dysphagia symptoms.  However, since being hospitalized several days ago for new onset atrial fibrillation, she has had a lot of eructation and some sense of difficulty swallowing, prompting a barium swallow this afternoon that showed severe presbyesophagus and a soft tissue density or filling defect in the region of the distal esophagus, not necessarily felt to be a foreign body. PAST MEDICAL HISTORY:  No known allergies.  MEDICATIONS IN THE HOSPITAL:  Warfarin, diltiazem, Lasix, Uloric, colchicine, Lovaza, fenofibrate, Senokot, lisinopril, Coreg, Aldactone, and Percocet.  OPERATIONS:  Lumpectomy and knee replacement.  MEDICAL ILLNESSES:  Hypertension, hyperlipidemia, history of gout, DJD, diverticular disease, and obesity.  No known coronary artery disease or COPD or diabetes from review of the electronic record.  SOCIAL HISTORY:  The patient lives alone despite her advanced age, but is looked after by her daughter who lives nearby.  PHYSICAL EXAMINATION:  GENERAL:  The patient is not dyspneic at this time.  She is anicteric and without pallor. VITAL SIGNS:  Heart rate 80, temperature 98, blood pressure 154/71, and respirations 18, unlabored. HEENT:  Anicteric, no pallor. CHEST:  Clear anterolaterally. HEART:  Harsh systolic murmur at the upper left sternal border. ABDOMEN:  Without mass or tenderness.  The patient  did have some eructation during the course of my visit.  LABORATORY DATA:  White count as of 3 days ago was 4500, hemoglobin 11.6, MCV 90, and platelets 180,000.  Differential count pertinent for 14% monocytes which is minimally elevated.  INR as of today is 1.44. Chemistry panel following diuresis shows BUN 36 and creatinine 1.5. Liver chemistries normal on admission, albumin 3.3.  BNP elevated at 574.  TSH normal.  Barium swallow see above.  Presbyesophagus and possible distal esophageal filling defect.  IMPRESSION: 1. Abnormal radiographic appearance of the esophagus, which I suspect     is due to retained food. 2. Esophageal dysmotility, probably accounting for some of her     dysphagia symptoms.  PLAN:  Endoscopic evaluation tomorrow, the nature, purpose, and risks of which have been reviewed with the patient and she is agreeable.  I feel the procedure is indicated given the abnormal finding on her esophagram, and I feel that it can be done with reasonable safety despite the patient's advanced age, recent dyspnea, and anticoagulated status.          ______________________________ Bernette Redbird, M.D.     RB/MEDQ  D:  06/06/2010  T:  06/07/2010  Job:  [id number]  cc:   Leeroy Bock  Donnie Aho, M.D. Marjory Lies, M.D.  Electronically Signed by Bernette Redbird M.D. on 07/15/2010 12:47:33 PM

## 2010-07-15 NOTE — Op Note (Signed)
NAME:  [name], [name]                 ACCOUNT NO.:  [id number]  MEDICAL RECORD NO.:  [id number]           PATIENT TYPE:  LOCATION:                                 FACILITY:  PHYSICIAN:  Bernette Redbird, M.D.   DATE OF BIRTH:  19-Dec-1917  DATE OF PROCEDURE: DATE OF DISCHARGE:                              OPERATIVE REPORT   PROCEDURE:  Upper endoscopy with biopsies.  INDICATIONS:  A 77 year old female in the hospital for new-onset AFib, recently anticoagulated, but with subacute onset of intense esophageal dysphagia to the point where she is having trouble even handling her own secretions, often with eructation.  Barium swallow showed severe esophageal spasm and presbyesophagus with a question of a distal filling defect in the esophagus.  FINDINGS:  Severe desquamating exudative esophagitis and esophageal spasm, but no evident tumor or mass or stricture.  PROCEDURE:  The nature, purpose, and risks of the procedure have been discussed with the patient who provided written consent and was brought from her hospital room in a fasted state to the endoscopy unit where she was sedated with fentanyl 25 mcg and Versed 5 mg IV without clinical instability.  The Pentax pediatric video endoscope was passed under direct vision.  The vocal cords were not well seen.  The esophagus was entered without undue difficulty.  The first several centimeters of the esophagus were relatively normal, but beyond that, the mucosa was coated with desquamating, peeling exudate that was self-adherent.  The underlying esophageal mucosa actually looks fairly normal.  These changes were confluent and circumferential all the way down to the GE junction.  No esophageal mass or tumor was evident, although there was a clumped of exudate in the distal esophagus, which had been desquamated and may have been interpreted as a filling defect on the barium swallow yesterday.  There was no evident esophageal stricture.  The  esophageal wall had a somewhat wash-boarded appearance consistent with esophageal spasm.  Several biopsies were obtained using the pediatric biopsy forceps.  There was no significant bleeding from these despite the fact that the patient's INR is 2.01 today.  The stomach was unremarkable including a retroflexed view of the cardia.  No significant hiatal hernia was present, although I do think a small hiatal hernia was seen.  The pylorus, duodenal bulb, and second duodenum looked normal.  The patient tolerated the procedure well, and there were no apparent complications.  IMPRESSION: 1. No distal esophageal mass or tumor as was suggested to be possibly     present based on the barium swallow yesterday. 2. Severe desquamating exudative esophagitis and associated esophageal     spasm, which, in my opinion, readily accounts for the patient's     current dysphagia symptoms.  PLAN:  Intensive anti-peptic therapy with an IV Protonix infusion, while keeping the head of the bed elevated.  We will add sucralfate suspension to see if that might help a little bit.  Await pathology results.  In the meantime, the patient should eat frequent small bites of food interspersed with swigs of water to help wash things down, and she should eat in as  upright a posture as possible.  I would anticipate, it will take several days before her esophagitis quiets down to the point where her swallowing function noticeably improves.          ______________________________ Bernette Redbird, M.D.     RB/MEDQ  D:  06/07/2010  T:  06/07/2010  Job:  [id number]  cc:   Marjory Lies, M.D. Viann Fish  Electronically Signed by Bernette Redbird M.D. on 07/15/2010 12:47:39 PM

## 2010-07-18 NOTE — Discharge Summary (Signed)
[name], [name] NO.:  [id number]  MEDICAL RECORD NO.:  [id number]           PATIENT TYPE:  I  LOCATION:  4714                         FACILITY:  [facility]  PHYSICIAN:  Lyn Records, M.D.   DATE OF BIRTH:  [birth year]  DATE OF ADMISSION:  06/03/2010 DATE OF DISCHARGE:  06/10/2010                              DISCHARGE SUMMARY   REASON FOR ADMISSION:  Congestive heart failure.  DISCHARGE DIAGNOSES: 1. Acute diastolic heart failure secondary to problems #2 and #3     below. 2. Atrial fibrillation with rapid ventricular response of unknown     duration.     a.     Treatment strategy, rate control. 3. Hypertension with left ventricular hypertrophy and diastolic     dysfunction documented by echo. 4. Dehydration secondary to diuresis for problem #1. 5. Dysphagia due to distal erosive esophagitis documented by upper     endoscopy  PROCEDURES PERFORMED:  Echocardiography on June 05, 2010, barium swallowon June 06, 2010, and upper endoscopy and biopsy by Dr. Bernette Redbird on June 07, 2010.  CONSULTATIONS:  Bernette Redbird, MD, [facility].  DISCHARGE INSTRUCTIONS:  MEDICATIONS: 1. Carvedilol 625 mg p.o. b.i.d. 2. Diltiazem CD 120 mg per day. 3. Pantoprazole 40 mg p.o. b.i.d. 4. Warfarin 2 mg tablets 1 each evening, start on June 11, 2010. 5. Colchicine 0.6 mg b.i.d. 6. Fish oil 850 mg daily. 7. Hydrochlorothiazide 25 mg resume on June 13, 2010, one per day. 8. Lisinopril 20 mg daily, resume on June 13, 2010. 9. Percocet 5/325 mg 1 every 3 hours as needed. 10.Senna/docusate 4 tablets at bedtime. 11.Systane eye drops in both eyes. 12.TriCor 145 mg daily. 13.Uloric 40 mg daily.  The patient is to discontinue Norvasc and Motrin.  FOLLOWUP:  Follow up with Dr. Viann Fish on June 16, 2010.  INR with Dr. Donnie Aho on June 16, 2010.  The patient is to call Dr. Verdis Prime if clinical problems before that time.  ACTIVITY:  As tolerated.  DIET:  Low  salt.  HISTORY/PHYSICAL AND HOSPITAL COURSE:  Please refer to the admitting history and physical.  There was a several-week history of lower extremity swelling.  Found to be in atrial fibrillation on admission.  Echo demonstrated left ventricular hypertrophy, EF of 60%, and diastolic dysfunction.  She also was found to have moderate-to-severe aortic stenosis with an aortic valve area in the 0.8 cm2 range.  A strategy of rate control was established for the patient and medication adjustments were made.  On her third hospital day, she developed dysphagia although this in retrospect of been going on for several weeks.  A barium swallow suggested a lower esophageal mass.  Consultation with [facility] by Dr. Matthias Hughs led to upper endoscopy which revealed severe erosive esophagitis.  Therapy with sucralfate and pantoprazole was started.  Aggressive diuresis was begun on admission including Lasix intravenously and spirolactone.  The patient developed progressive renal insufficiency over the weekend prior to discharge.  All diuretics were discontinued. At the time of discharge, she is still slightly prerenal but not orthostatic.  She will be discharged home  on no diuretic therapy but is to resume hydrochlorothiazide on June 13, 2010.  She should call if shortness of breath or lower extremity swelling recurs.  It is assumed that rate control and management of diastolic heart failure will allow her to use her same diuretic dose without significant problems.  She is instructed to follow up with Dr. Viann Fish in 5-7 days.  She is to call with clinical problems.     Lyn Records, M.D.     HWS/MEDQ  D:  06/10/2010  T:  06/11/2010  Job:  [id number]  cc:   Marjory Lies, M.D.  Electronically Signed by Verdis Prime M.D. on 07/18/2010 04:53:25 PM

## 2010-08-01 ENCOUNTER — Ambulatory Visit: Payer: Medicare Other | Admitting: Physical Medicine and Rehabilitation

## 2010-08-08 ENCOUNTER — Encounter
Payer: Medicare Other | Attending: Physical Medicine and Rehabilitation | Admitting: Physical Medicine and Rehabilitation

## 2010-08-08 DIAGNOSIS — M47817 Spondylosis without myelopathy or radiculopathy, lumbosacral region: Secondary | ICD-10-CM | POA: Insufficient documentation

## 2010-08-08 DIAGNOSIS — M1710 Unilateral primary osteoarthritis, unspecified knee: Secondary | ICD-10-CM

## 2010-08-08 DIAGNOSIS — M545 Low back pain, unspecified: Secondary | ICD-10-CM | POA: Insufficient documentation

## 2010-08-08 DIAGNOSIS — M19029 Primary osteoarthritis, unspecified elbow: Secondary | ICD-10-CM

## 2010-08-08 DIAGNOSIS — Z79899 Other long term (current) drug therapy: Secondary | ICD-10-CM | POA: Insufficient documentation

## 2010-08-08 DIAGNOSIS — M47812 Spondylosis without myelopathy or radiculopathy, cervical region: Secondary | ICD-10-CM | POA: Insufficient documentation

## 2010-08-08 DIAGNOSIS — M25519 Pain in unspecified shoulder: Secondary | ICD-10-CM | POA: Insufficient documentation

## 2010-08-08 DIAGNOSIS — M48062 Spinal stenosis, lumbar region with neurogenic claudication: Secondary | ICD-10-CM | POA: Insufficient documentation

## 2010-08-08 DIAGNOSIS — M25569 Pain in unspecified knee: Secondary | ICD-10-CM | POA: Insufficient documentation

## 2010-08-08 DIAGNOSIS — M19019 Primary osteoarthritis, unspecified shoulder: Secondary | ICD-10-CM | POA: Insufficient documentation

## 2010-08-08 DIAGNOSIS — M542 Cervicalgia: Secondary | ICD-10-CM | POA: Insufficient documentation

## 2010-08-08 NOTE — Assessment & Plan Note (Signed)
[name] is a pleasant 77 year old widowed female who lives independently and is accompanied by her daughter.  She was last seen by me on May 05, 2010.  In the interim she has had a hospitalization for generalized weakness, abnormal lab values, and hyponatremia.  This was in March 22 through March 24.  Previous to that she was also hospitalized for acute diastolic heart failure secondary to atrial fibrillation with rapid ventricular response and hypertension.  She reports overall she is doing relatively well right now, she is back living independently.  She feels overall good.  She is here at our center for pain and rehabilitative medicine for refill of her pain medications.  She continues to have problems with pain in multiple joints including her neck and low back, shoulders and knees.  She has no significant cervical spondylosis, lumbar spondylosis and severe bilateral knee osteoarthritis or neurogenic claudication.  Her average pain is about 6-8 on a scale of 10.  Pain is worse with activities, improves with rest and heat, medication and TENS unit.  She has also recently tried icy hot patch and finds that they are somewhat beneficial as well and has started using them instead of the Lidoderm patch.  Functional status is unchanged.  She continues to live independently, is able to manage her ADLs including feeding, dressing, bathing, toileting, and light meal prep and household duties.  No new problems with respect to bowel or bladder, depression, anxiety or suicidal ideation.  No new numbness, weakness, or tingling.  Past medical, social, family history, reviewed.  Medications prescribed through center for pain include Percocet 5/325 q.4-6 h not more than 5 tablets per day.  PHYSICAL EXAMINATION:  VITAL SIGNS:  Blood pressure is 150/70, pulse 86, respirations 18, and 97% saturated on room air. GENERAL:  She is well-developed, well-nourished elderly female who does not  appear in any distress. NEUROLOGICAL:  She is oriented x3.  Speech is clear.  Affect is bright. She is alert, cooperative and pleasant.  Follows commands without difficulty, answers my questions appropriately.  Cranial nerves and coordination are intact.  Her reflexes are diminished in both upper and lower extremities.  No abnormal tone clonus or tremors are noted.  Her motor strength is quite good in both upper and lower extremities.  No new sensory deficits are appreciated.  MUSCULOSKELETAL:  Transitioning from sitting to standing is done without using upper extremities to push up.  Her gait is stable, some difficulty with tandem gait.  Romberg test is performed adequately.  Diminished range of motion in her neck with rotation to the left rather significant.  She has significant loss of range of motion in both shoulders, able to abduct to not more than 45 degrees.  Tenderness at bilateral knees without obvious effusion is appreciated.  She has a left knee replacement.  She has limitations in lumbar motion in all planes.  IMPRESSION: 1. Cervical spondylosis with diminished cervical range of motion,     known chronic bony fusion at 5-6 as well as 6-7, advanced cervical     facet disease as well as degenerative changes at C1-2. 2. Severe shoulder osteoarthritis with decreased range of motion     bilaterally. 3. Bilateral knee osteoarthritis.  History of left knee replacement. 4. Lumbar spondylosis/spinal stenosis neurogenic claudication.  PLAN:  We will continue to monitor her pill counts.  She takes Percocet 5/325 up to 5 tablets per day.  She takes her medication as prescribed without evidence of aberrant behavior.  No  problems with sedation or constipation from this medication is appreciated per patient report.  She has discontinued Lidoderm and is currently using icy hot patches and finds them to be somewhat helpful as well.  I will see her back in 2 months' nursing visit next  month for pill counts and refill of meds. She is comfortable with our plan.     [name], M.D.    DMK/MedQ D:  08/08/2010 09:38:55  T:  08/08/2010 20:57:23  Job #:  [id number]

## 2010-08-12 NOTE — Assessment & Plan Note (Signed)
[name] is a pleasant 77 year old widowed female who is accompanied by  her daughter today to our [facility] for refill of  pain medications.   She was last seen by me on July 04, 2007.  In the interim, she states  that she has been healthy.  She is able to make meals, she goes for  walks intermittently, and is able to continue to live independently.  Her average pain is located in her bilateral shoulders and low back and  bilateral knees predominantly.  She also is complaining of some  bilateral calf and anterior tibial pain when she stands up for a  prolonged period of time.   She states her pain interferes significantly with her activity.  Her  pain is worse when the pain pills wear off.   She gets fair relief with the current medications that she is on.   MEDICATIONS:  Provided by this clinic:  1. Protonix 40 mg daily.  2. Norco 10/325 up to 5 tablets per day.  3. Lidoderm p.r.n.   FUNCTIONAL STATUS:  She continues to live independently.  Is independent  with feeding, dressing, bathing, toileting, meal prep.   She denies problems controlling bowel or bladder.  She denies  depression, anxiety, or suicidal ideation.   REVIEW OF SYSTEMS:  Otherwise noncontributory.   Physicians involved in her care include Dr. Caryl Never and Dr. Evlyn Kanner.   No changes in past medical, social, or family history since last visit.   PHYSICAL EXAMINATION:  VITAL SIGNS:  Blood pressure 178/88, pulse 84,  respirations 16, 97% saturation on room air.  GENERAL:  She is a well-developed elderly female who does not appear in  any distress.  She is oriented x3.  Her speech is clear.  Her affect is  bright.  She is alert, cooperative, and pleasant.  She follows commands  without difficulty.   Transitioning from sitting to standing is done slowly.  Her gait in the  room displays a symmetric gait, slightly wide-based and somewhat slow.  She has limitations in lumbar motion in all  planes.  She has limitations  in cervical range of motion in all planes.  Shoulder range of motion,  she is limited to about 50 degrees of abduction bilaterally.   Reflexes are diminished in both lower extremities, 0 at the patellar and  Achilles' tendons.  There is no abnormal tone noted.  Her motor strength  is overall good and 5/5 at hip flexors, knee extensors, dorsiflexors,  and plantarflexors.  No clonus is noted.   IMPRESSION:  1. Severe shoulder osteoarthritis with limitations in shoulder motion.  2. Cervical spondylosis with cervicalgia.  3. Bilateral knee osteoarthritis with total knee replacement on the      left.  4. Severe lumbar spondylosis with lumbago and some radiation of pain      to the lower extremities with standing and walking limiting her      overall ability to stay up for any length of time.  Her limitations      are not more than 10-15 minutes.   PLAN:  We will refill her Norco 10/325 one p.o. q.4-6h., not more than 5  pills per day, #150 per month.  I gave her samples of some Lidoderm  today.  She has been using this for quite a while.  She knows the risks  and benefits and that she needs to dispose of it properly.   She has requested epidural steroid injections.  She  has had them done in  the past by Dr. Ethelene Hal and would like to pursue this again to see if we  can improve the radiating leg pain that she gets with standing for any  length of time.   We will see her back in a month and will set her up for lumbar epidural  steroid injections.  She continues to use her medications properly.  No  aberrant behavior is observed.  She had 13 tablets left from last month.  She has not had any problems with side effects from these medications  such as dizziness or oversedation or constipation.           ______________________________  Brantley Stage, M.D.     DMK/MedQ  D:  08/05/2007 09:58:26  T:  08/05/2007 10:29:48  Job #:  [id number]

## 2010-08-12 NOTE — Assessment & Plan Note (Signed)
Ms. Bivens is an 77 year old widowed female, who is accompanied by her  daughter, who is [name].  She was last seen by me on October 21, 2007.   Ms. Blow is still interested in trying to find some more improved  relief for her arthritis symptoms, which she is experiencing in multiple  joints including her cervical and lumbar spine, bilateral shoulders, and  bilateral knees.  Her average pain is between a 7 and 8 on a scale of  10, interfering significantly with activity level.  Pain is typically  worse with standing and activities; improves with rest and medication.   She is able to walk between 5 and 10 minutes at a time.  She does not  climb stairs.  She no longer drives.  She does continue to live  independently and is able to make small meals and manage herself with  respect to dressing, bathing, toileting, and feeding.   No changes in her review of systems today.  She has not had no  intervening medical problems since I saw her last on October 21, 2007.  No  changes in social or family history.   Medications which are provided through this clinic include Norco 10/325  one p.o. every 46 hours #150 for a month and Lidoderm on a p.r.n. basis.   PHYSICAL EXAMINATION:  VITAL SIGNS:  On exam today, blood pressure is  120/70, pulse 93, respirations 18, and 98% saturated on room air.  GENERAL:  She is an adult, elderly female, who does not appear in any  distress.  She is oriented x3.  Speech is clear.  Affect is bright.  She  is alert, cooperative, and pleasant.  She follows commands without any  difficulty.  She has limitations in cervical range of motion in all  planes.  She has limitations in lumbar motion in all planes.  Her  shoulders are limited to not more than 45 degrees of abduction  bilaterally.  She is able to straighten her knees up to 180 bilaterally.  Hip motion is mildly limited without pain.   She is able to transition from sitting to standing with relative ease.  Her  gait in the room is stable.  Tandem gait, she does have some mild  problems with.  She is able to perform a Romberg test easily.  Coordination is grossly intact.  Her motor strength in the lower  extremities is quite good; 5/5 at hip flexors, knee extensors,  dorsiflexors, and plantar flexors.  Her reflexes are diminished at the  patellar and Achillis tendons.  No abnormal tone is noted.  No clonus is  noted.   IMPRESSION:  1. Severe bilateral shoulder osteoarthritis with limitations in range      of motion.  2. Bilateral knee osteoarthritis with total knee replacement on the      left.  3. Cervical spondylosis with history of cervicalgia.  4. Lumbar spondylosis with lumbago and symptoms of spinal stenosis.   PLAN:  Ms. Strubel would like to attempt to overall improve her pain.  She  is interested in trialing oxycodone at this point.  She has been on  hydrocodone for many years now.  An attempt was made to switch her to  tramadol.  She did not find it to be particularly helpful in managing  her pain last month.  Prior to switching her to Percocet, we will  decrease her Norco from 10/325 down to 7.5/325 p.o. every 4- 6 hours not  more than  5 tablets per day for 2 weeks.  At the next visit, I anticipate we will trial her on Percocet 5/325 one  p.o. every 4-6 hours not more than 5 per day.  She is set up for an  appointment in 2 weeks.           ______________________________  Brantley Stage, M.D.     DMK/MedQ  D:  11/23/2007 09:46:38  T:  11/24/2007 00:53:37  Job #:  [id number]

## 2010-08-12 NOTE — Assessment & Plan Note (Signed)
[name] is an 77 year old widowed female who is accompanied by  her daughter.   She was last seen by me on Aug 05, 2006.   She is being followed in our [facility] for multiple  pain complaints predominantly related to cervical stenosis/spondylosis  with radiating shoulder pain.  She also has osteoarthritis of the  glenohumeral joints bilaterally and a history of bilateral knee  osteoarthritis worse on the right than on the left.   She is back in today for refill of her medications.  She has been off of  her Neurontin now about six weeks.  It caused her some swelling.   She reports fair to good relief with using Norco 10/325 up to five times  a day as well as Biofreeze and 600 mg of ibuprofen per day.   She also started using a soft cervical collar about a month ago as well  as has reported overall improvement with its use as well.   She lives a fairly sedentary life.  She is able to ambulate  independently within her home.  She is independent with her self care,  needs some assistance with higher level household activities.  No  changes in past medical, social or family history since our last visit.   MEDICATIONS:  Medications prescribed by this clinic include:  P.R.N. Lidoderm which she gets as samples.  Ibuprofen 600 mg 1 p.o. once a day.  She uses over the counter  medications for this.  Norco 10/325 up to 5 times per day.  Protonix 40 mg one p.o. once a day.   PHYSICAL EXAMINATION:  On exam today her blood pressure is 149/71, pulse  89, respirations 16, 94% saturated on room air.   She is a well-developed, well-nourished, elderly female who appears her  stated age.  She does not appear in any distress.   She is oriented x 3.  Speech is clear.  Affect is bright, alert,  cooperative and pleasant.  She follows commands without any difficulty.   She transitions from sitting to standing independently.  Her gait in the  room is slow and she has good  balance.   Tandem gait is performed with difficulty and Su Hilt test is performed  adequately.   Reflexes are diminished in the lower extremities as well as upper  extremities. Motor strength overall is in the 5/5 range.  She has  significantly limited motion in both shoulder joints.  Abduction is less  than 50 degrees.   She reports no numbness with light touch in the upper or lower  extremities today.  She has significantly limited cervical range of  motion to the left; in fact, when I ask her to turn her head to the left  she rotates her entire body.   IMPRESSION:  1. Cervical spondylosis/stenosis.  2. Lumbar spondylosis.  3. Bilateral knee OA.  4. Bilateral shoulder OA.   PLAN:  Continue Norco 10/325 up to 5 times per day.  We will continue  Biofreeze and soft collar on a p.r.n. basis.  We will decrease ibuprofen  to 400 mg once a day.   We discussed possibly switching her to Percocet rather than Norco.  She  would like to stay with the Norco for another month yet.  We will see  her back in 1 month.  A prescription was written Norco 10/325 1 p.o.  q.i.d. and h.s. #150 no refills, to use on a p.r.n. basis.  ______________________________  Brantley Stage, M.D.     DMK/MedQ  D:  08/25/2006 13:44:29  T:  08/25/2006 14:29:44  Job #:  [id number]

## 2010-08-12 NOTE — Assessment & Plan Note (Signed)
[name] is a 77 year old widowed female who is accompanied by her  daughter to our pain and rehabilitative clinic this morning.  She was  last seen by me on Aug 25, 2006.   She is being followed in our clinic for multiple pain complaints  including cervicalgia, bilateral knee pain and bilateral shoulder pain,  all related to degenerative joint disease.   She is back in today and states her average pain is about a 7 on a scale  of 10.  She reports between fair and good relief with medications that  are prescribed to her.   Pain is localized to the shoulder and knees today, her neck is not  bothering her as much as it did in previous months.   MEDICATIONS PRESCRIBED BY THIS CLINIC:  Include Norco 10/325 one p.o. up  to 5 times a day, 150 a month.   She also uses a TENS unit and Lidoderm on a p.r.n. basis as well as  Biofreeze.  She has been obtaining samples of Lidoderm through our  clinic.   She is independent with all of her self care.  She is able to feed,  bathe, dress herself, toilet herself.  She does complain somewhat about  not being able to get ready for church, apparently it takes her quite a  bit of time to prepare herself and she is fairly worn out before she  finishes.   Past medical, social, family history are otherwise unchanged since last  visit.  She continues to follow up with Dr. Caryl Never.  She saw Dr.  Caryl Never within the last year, possibly about 3 months ago.   EXAMINATION:  Today blood pressure is 156/78, pulse 79, respirations 16,  97% saturated on room air.  She is a well-developed, well-nourished,  elderly female who appears younger than her stated age.  She is oriented  x3 and does not appear in any distress.  Her speech is clear, her affect  is bright, alert, and cooperative.  She follows commands without  difficulty.   She transition from sitting to standing without difficulty.  Her gait in  the room reveals a short stride length bilaterally, but  not particularly  wide based and her gait is quite stable for the most part.  She is able  to perform a Romberg's test adequately.  Tandem gait was not tested  today.   She has significant limitation in cervical range of motion.  Very little  rotation to the left is noted today, right rotation is approximately  about 25 degrees.  She has between 35% and 45% of abduction and forward  flexion of her shoulders bilaterally.   Her reflexes are 1+ at the biceps, diminished at the triceps and  brachial radialis; 1+ at the bilateral patellar tendon, 0 at the ankles  bilaterally.  Her motor strength is quite good throughout, no focal  deficits are appreciated on exam today.   IMPRESSION:  1. Cervical spondylosis/stenosis.  2. Lumbar spondylosis.  3. Bilateral knee osteoarthritis.  4. Bilateral shoulder osteoarthritis.   PLAN:  Encouraged her to continue to use cervical collar for neck pain  on a p.r.n. basis, this seems to have helped with her neck and shoulder  pain somewhat.  Continues to use a TENS unit on a p.r.n. basis and  Lidoderm as well as Biofreeze.  Will refill her Norco 10/325 up to 5  times per day, encouraging not more than q.4-6 hour intervals.  They  have mentioned switching to  Percocet, she would like to wait until after  an August 30 wedding before she has her medications switched.  Will have  her see nurse next month, anticipate refill Norco 10/325 q.4-6 hours  #150, encouraging not more than 5 times per day usage.   [name] has not had any complaints of problems with constipation or  over sedation with the medications.  She understands these are possible  side effects.  I also discussed tolerance and withdrawal symptoms with  her today as well with her daughter present.           ______________________________  Brantley Stage, M.D.     DMK/MedQ  D:  10/06/2006 09:39:53  T:  10/06/2006 14:00:30  Job #:  [id number]

## 2010-08-12 NOTE — Assessment & Plan Note (Signed)
[name] is an 77 year old widowed woman who is accompanied by her  daughter to our Pain and Rehabilitative Clinic.  She is here for a  refill of her pain medications and a brief recheck.  She is a patient of  Dr. Caryl Never.   [name] was last seen by me on March 07, 2008.  In the interim, she  states that she has been healthy and has not had any medical problems.  She continues to live independently and is able to take care of her  activities of daily living and is not having significant problems with  mobility.   She has complaints of pain in bilateral shoulders and right knee,  somewhat in the left knee as well, and occasionally low back.   She states her average pain is about an 8 on a scale of 10, interferes  significantly with activity levels.  Pain is described as aching, dull,  stabbing, burning, and sharp in nature at times.   She sleeps fairly well.   Pain is constant throughout the day and she does take a break to  decrease her pain level when she is up and active.   Pain is typically worse with walking, bending, prolonged sitting,  prolonged standing.   Improves with rest, heat, medication, and TENS unit as well as Lidoderm  patches.   She reports good relief with current medications.   Medications provided through this clinic include Percocet 5/325 up to 5  times per day and Lidoderm patches on a p.r.n. basis.   FUNCTIONAL STATUS:  She is able to stand or walk 10-15 minutes at a  time.  She does not climb stairs nor does she drive.  She is independent  with self-care including feeding, dressing, bathing, toileting.  She  also does some light meal prep.   Review of systems is negative.   Past medical, social, family history are unchanged since last visit.   PHYSICAL EXAMINATION:  Blood pressure is 150/188, pulse 94, respiration  18, 98% saturated on room air.   She is an elderly, mildly obese female who does not appear in any  distress.  She is oriented  x3.  Her speech is clear.  Her affect is  bright.  She is alert, cooperative, and pleasant, and she answers all  questions appropriately.   Cranial nerves are notable for just some mild hearing deficits.  Coordination is intact.  Reflexes are diminished in both upper and lower  extremities.  No sensory deficits are appreciated with light touch  today.   She has significant limitations in cervical range of motion especially  with rotation to the left.  She has about 10 degrees of motion, about 30  degrees of rotation to the right is noted.   Shoulder exam reveals limited internal and external rotation as well as  limited abduction bilaterally to about 45 degrees.  She has about 70  degrees of forward flexion of the shoulders.   Lumbar range of motion is limited in all planes as well.   She has crepitus in the right knee joints.  Left knee joint has full  range of motion, well-healed scar over the inferior surface of the  patella.   She has no pain with internal or external range of motion at either hip  today.   Her motor strength is quite good overall without any focal deficits.  She is able to rise out of the chair without pushing off with her upper  extremities.  Her gait reveals a short stride length, but free gait is  stable overall.  Balance is overall quite good as well.   IMPRESSION:  1. Chronic pain syndrome.  2. Severe bilateral shoulder osteoarthritis.  3. Bilateral knee osteoarthritis, right knee worse than left, has a      total knee replacement on the left.  4. Cervical spondylosis with intermittent cervicalgia.  5. Lumbar spondylosis with chronic low back pain and symptoms of mild      spinal stenosis.   PLAN:  We will refill her medications today 5/325 Percocet 1 p.o. q.4-6  h., not more than 5 tablets per day.   The patient denies any problems with oversedation or constipation from  this medication.  She takes it as prescribed.  She does not exhibit   aberrant behavior.  Her cell counts are appropriate, and she does report  overall decrease in pain and is able to maintain an independent and  functional lifestyle despite significant functional impairment.   We will see her back in 1 month.           ______________________________  Brantley Stage, M.D.     DMK/MedQ  D:  04/11/2008 09:06:12  T:  04/11/2008 21:19:02  Job #:  [id number]   cc:   Evelena Peat, M.D.

## 2010-08-12 NOTE — Assessment & Plan Note (Signed)
[name] is an 77 year old widowed female who is accompanied by her  daughter for a recheck in our pain and rehabilitative clinic.  She is  here for refill of her medications.   She is being seen in our clinic for multiple pain complaints, including  bilateral knee pain with osteoarthritis and total knee replacement on  the left, cervical spondylosis/stenosis with cervicalgia, severe  bilateral shoulder osteoarthritis, lumbar spondylosis and intermittent  lumbago.   Over the last month she has done well.  No medical problems in the  intervening period.  She has been able to socialize somewhat and is able  to make meals the microwave oven.   Average pain is about a 9 on a scale of 10.  The pain is constant,  sharp, burning, stabbing, dull, aching in nature.  Sleep tends to be  good.  She reports good relief with the current meds that are  prescribed.  Pain is typically worse with activities, improves with  rest, heat, medication and TENS unit.   She is able to walk a few minutes at a time, limited by her knees and  her back.  She does not drive.  She is independent with her self care  and needs assistance with higher level household activities.   REVIEW OF SYSTEMS:  Noncontributory.   PAST MEDICAL/SOCIAL/FAMILY HISTORY:  Otherwise unchanged.   PHYSICAL EXAMINATION:  Blood pressure is elevated, 202/86.  She did not  take her medication this morning.  Recheck was 158/82.  Pulse 86,  respirations 18, 97% saturated on room air.  She is not short of breath.  No chest pain.  No new neurologic symptoms.  She is a well-developed, well-nourished female who does not appear in  any distress.  She is oriented x3, her speech is clear, her affect is  bright, alert.  She is cooperative and pleasant.  She follows commands  without any problems.  Transitioning from sit to stand is done slowly.  Gait in the room is  stable.  Short stride length bilaterally.  Stable gait.  Tandem gait is  not  assessed.  Romberg's test is performed adequately.  Limitation in cervical range of motion:  Lacks about 50% of motion with  rotation to the left, about 10% to the right.  Flexion/extension are  mildly limited as well.  She has limitations in shoulder abduction  bilaterally, 45 degrees with abduction, and about 80 degrees with  forward flexion.  Lumbar motion is significantly limited as well.  Reflexes are diminished in the lower extremities.  Her motor strength is good at hip flexors, knee extensors, dorsiflexors,  plantar flexors, EHL.  Full knee range of motion.   IMPRESSION:  1. Bilateral knee osteoarthritis with total knee replacement on the      left.  2. Cervical spondylosis/stenosis with cervicalgia.  3. Bilateral severe shoulder osteoarthritis.  4. Lumbar spondylosis with intermittent lumbago.   PLAN:  Will refill her Norco today 10/325 one p.o. q.i.d. q.4-6 h., not  more than 5 tablets per day.  She had 15 tablets left over from this  month.   She takes her medications as prescribed, uses them appropriately, and is  able to maintain independent living despite some significant physical  limitations with respect to her spine, shoulders, and knee.   We will see her back in 2 months, nursing visit next month for a refill  of her medications.  She has had no problems with over sedation or  constipation from these medicines.  ______________________________  Brantley Stage, M.D.     DMK/MedQ  D:  03/04/2007 09:31:53  T:  03/04/2007 11:22:39  Job #:  [id number]

## 2010-08-12 NOTE — Assessment & Plan Note (Signed)
[name] is an 77 year old widowed female who is accompanied by her  daughter, whose name is [name] executive.  She was last seen by me on  December 07, 2007.   At that time she was started on Percocet 5/325 up to 5 times per day.   She is back in a day and states that overall, she is doing well.  She  feels the Percocet has been very helpful in managing her pain.  On  December 07, 2007, her pain was 9 on a scale of 10, currently is between  the 6 and 7.  She denies any problems with over sedation or dizziness or  problems with constipation from the medications.  She feels she is able  to be more active.   Pain is described as fairly constant, aching, dull, stabbing, burning,  and sharp in nature; predominately bilateral shoulders and knees.   Pain does not interfere with sleep.  It is worse when she is up and  active, improves with rest and medication, heat and pacing her activity.  She gets fair to good relief with current medications.   FUNCTIONAL STATUS:  She has a 64% on the Oswetry disability  questionnaire today.  This has improved from 73% at last visit.  She  lives independently.  She is independent with self-care.  She is able to  walk few minutes at a time.   Denies problems controlling bowel or bladder.  Denies any new problems  with respect to review of systems since last visit.   There is no changes in past medical, social, or family history.   MEDICATIONS:  Provided through this clinic include, Percocet 5/325 one  p.o. q.4-6 hours more than 5 per day and p.r.n. Lidoderm samples as well  as Biofreeze topical.   Nursing staff reported, on initial evaluation, she had a slightly  irregular pulse, however, rechecking it today myself at the wrist, she  appears to have a regular pulse at this time.  I do not appreciate any  irregularities.   PHYSICAL EXAMINATION:  VITAL SIGNS:  Her blood pressure 150/80, pulse  82, respirations 18, and 98% saturated on room air.   She  denies any problems with shortness of breath, chest pain, chest  pressure, and overall fatigue.   GENERAL:  She is a well-developed elderly female who does not appear in  any distress.  She is oriented x3.  Speech is clear.  Affect is bright.  She is alert, cooperative, and pleasant.  She follows commands without  any difficulty.  NEUROLOGIC:  Cranial nerves are grossly intact.  Coordination is intact.  Reflexes are diminished in the lower and upper extremities.  Motor  strength is 5/5 in the lower extremities limited at the shoulders  bilaterally with abduction due to shoulder pain.  Distal upper extremity  strength is in the 5/5 range.  No new sensory deficits are appreciated.   Her gait displays a short stride length, slightly wide based, but  stable.  She is able to ambulate independently in the room without her  cane.   Her forward flexion is fairly well preserved.  She has very limited  lumbar extension and flattening of the lumbar spine.   Exam of the shoulders revealed significant loss of range of motion of 30  degrees bilaterally.  She has crepitus as she even slightly moves her  shoulders in any direction.   Bilateral knees are tender in medial and lateral joint lines length  bilaterally.  No  effusion is noted.  She does have tenderness, however.   IMPRESSION:  1. Severe bilateral shoulder osteoarthritis with limitations in range      of motion not more than 30-45 degrees of abduction.  2. Bilateral knee osteoarthritis with total knee replacement on the      left.  3. Cervical spondylosis with history of cervicalgia.  4. Lumbar spondylosis with lumbago and symptoms of spinal stenosis.   PLAN:  Ms. Rodick appears to be tolerating her switch of medications  well.  She had been on hydrocodone.  She is currently on Oxycodone 5 mg  not more than 5 tablets per day.  She does not have any problems with  over sedation, constipation, dizziness, and nausea.  She appears to be   pleased with the medication switch.  Her Oswetry disability  questionnaire showed overall improvement from 73% to 74%.  Regarding the  nurse noting a occasional irregularity in her pulse, I asked her to  follow up with primary care, rechecking it today.  I did not notice any  pulse irregularity, however, I would like her to follow up with primary  care for this.  Family understands those and they will comply.  I will  see her back in a month.  She is given a month's prescription of  Percocet.           ______________________________  Brantley Stage, M.D.     DMK/MedQ  D:  12/21/2007 09:29:16  T:  12/22/2007 00:59:58  Job #:  [id number]

## 2010-08-12 NOTE — Assessment & Plan Note (Signed)
[name] is an 77 year old widowed female who is accompanied by her  daughter today for a recheck in our pain and rehabilitative clinic.  She  is here for a refill of her medications.   She has multiple pain complaints including intermittent cervicalgia,  bilateral shoulder pain, bilateral knee pain.  In the last couple of  months she has had some mid thoracic type discomfort which has resolved.  Average pain is about a 7 on a scale of 10.  Her chief complaint is her  knees.  In the morning when she wakes up and first starts to walk her  knees bother her quite a bit.  She has a knee replacement on the left.  The pain in her shoulders is worse when she uses them.  She has no  further problems with her thoracic spine of her low back.  No pain in  these areas today reported.   She denies any new numbness, tingling, weakness.  No bowel or bladder  problems.  No new balance problems.  No headaches.   No changes in past medical, social, or family history since the last  visit, other than she has recently seen Dr. Adrian Prince who ordered  some imaging scans, also placed her on some vitamin D.  Her family  history is remarkable recently for a daughter-in-law who may have  leukemia.  The patient is rather upset by this this morning.   MEDICATIONS:  Provided by this clinic include Norco 10/325 up to five  times per day, #150 per month only.   PHYSICAL EXAMINATION:  VITAL SIGNS:  Her blood pressure is 165/98, pulse  92, respirations 18, 98% saturated on room air.  GENERAL:  She is well developed, well nourished, elderly female who  appears her stated age.  MENTAL STATUS:  Oriented x3.  Speech is clear.  Affect is bright.  She  is alert, cooperative, pleasant.  She follows commands without any  difficulty.  MUSCULOSKELETAL:  Transitioning from sit-to-stand is done with ease.  Gait in the room reveals a shorter stride length, mildly wide based but  stable.  Limitations are noted in lumbar  range in all planes, very  little extension is appreciated.  Palpation throughout cervical,  thoracic, lumbar spine does not reveal any spinal tenderness today.  No  tenderness into the parascapular muscles nor the paraspinal muscles in  the thoracic or lumbar spine.  Reflexes are 2 plus at the left patellar  tendon, 1 plus to zero at the right patellar tendon, zero at the  Achilles tendons bilaterally.  She has an area of decreased sensation in  the left lateral leg consistent with an L4 or L5 dermatoma.  Her motor  strength, however, in the lower extremities is excellent.  There is no  weakness appreciated at hip flexors, knee extensors, dorsiflexors,  plantar flexors.  EHLs are both strong as well.  She has full knee range  of motion.   IMPRESSION:  1. Bilateral knee osteoarthritis with total replacement on the left.  2. Cervical spondylosis/stenosis with cervicalgia.  3. Bilateral severe shoulder osteoarthritis.  4. Lumbar spondylosis with intermittent lumbago.  5. Resolution of flank pain.   PLAN:  We will refill her Norco today at 10/325 one p.o. q.i.d. q.4-6 h.  not more than 5 tablets per day.  She had six tablets leftover from last  month.  She is taking her medications as prescribed, uses them  appropriately, and is able to  maintain independent living despite  some significant physical  limitations with respect to her spine and her shoulders and knees.  I  will see her back in a month.           ______________________________  Brantley Stage, M.D.     DMK/MedQ  D:  02/04/2007 09:02:22  T:  02/04/2007 12:29:33  Job #:  [id number]   cc:   Evelena Peat, M.D.

## 2010-08-12 NOTE — Assessment & Plan Note (Signed)
[name] is a pleasant 77 year old woman who is accompanied by her  daughter today.  She is being followed in our Pain and Rehabilitative  Clinic for multiple chronic pain complaints.  She uses  hydrocodone/acetaminophen up to 5 times a day and p.r.n. Lidoderm for  pain related to bilateral shoulder osteoarthritis, lumbago, and lumbar  spondylosis with symptoms of spinal stenosis.   She also has had a several months history of left flank pain, which  seems to have gotten worse in the last month or so and even more so in  the last week.  She was attending a graduation last month and was having  fairly excruciating left flank pain, at that time it seems to have  subsided.  In the last week, she has also had some increased pain in the  left flank region.  The pain in her flank seems to get worse with  activities when she is up and walking or standing.  It typically seemed  to get a little better when she is at rest, however, sometimes the pain  does continue at rest as well.   She recently underwent a right paramedian translaminar epidural  injection on August 29, 2007, at L4-L5 to help her symptoms of spinal  stenosis.   She states that the injection overall seems to have helped somewhat with  her leg pain when she stands.  Her walking and standing limitations are  still about 5-10 minutes.  She has difficulty climbing stairs.  She does  not drive.  She does use a cane.   Her average pain is about 8-9 on a scale of 10.  Her pain is described  as rather constant, sharp, burning, stabbing, dull, and aching in  nature.  Typically, the shoulders are worse with movement and knees are  worse with standing and movement.  Low back hurts more when she is  standing, and she gets leg pain after being up more than 5-10 minutes.  Her flank pain is intermittent but overall it seems to be associated  with upright activities.   She denies any problems with recent changes in her weight.  Denies  problems related to her abdomen, no changes in her bowel habits, denies  problems with urination.  She has no problems with bowel or bladder  control.  No particular abdominal pain.  Breathing deep breaths do not  exacerbate her flank pain.  She does not complain of any shortness of  breath or coughing or wheezing.  She has had no increased swelling in  her limbs.  No changes in bruising or bleeding.   No other changes in her past medical history.  Her last visit with her  primary care doctor was in the fall of 2008.   She continues to live independently and does get some help from her  daughter.  She denies alcohol use or smoking.   MEDICATIONS:  Which are prescribed from our clinic include,  1. Protonix 40 mg 1 p.o. daily.  2. Norco 10/325 up to 5 times a day.  3. P.r.n. Lidoderm patches.  4. Biofreeze.   PHYSICAL EXAMINATION:  Today, her blood pressure is 166/82, pulse 93, respirations 18, and 96%  saturated on room air.She is an elderly well-developed, well-nourished  female who does not appear in any distress.  She is oriented x3.  Her speech is clear.  Her affect is bright.  She is  alert, cooperative, pleasant, and joking.  She is able to follow  commands  without any problems.  She transitions from sitting to standing easily.  Her gait in the room  reveals shortened stride length bilaterally which is not new.  Her  balance is overall quite good.  She is able to forward flex in the  lumbar spine as well as extend and this does not increase her back pain  or her leg pain.  Nor do these maneuvers affect her flank pain.   Her reflexes are overall diminished at the patellar and Achilles  tendons.  There is no abnormal tone noted.  No clonus is noted.  Her  motor strength is 5/5 at hip flexors, hip adductors, and abductors.  Knee extensors and flexors as well as dorsiflexors and plantar flexors  are all 5/5 without any focal deficits.  She describes no sensory  deficits in the  lower extremities.  She has no tenderness over the  spinous processes from the lower thoracic to the lumbar spine or over  the sacrum.  She does have some areas of point tenderness in the 9th and  10th thoracic ribs approximately 6-7 cm from the midline.   IMPRESSION:  1. Left flank pain with rib tenderness.  2. Lumbar spondylosis with lumbago and symptoms of spinal stenosis.  3. Cervical spondylosis with cervicalgia.  4. Severe bilateral shoulder osteoarthritis with limitations in      shoulder range of motion.  5. Bilateral knee osteoarthritis with total knee replacement on the      left.   PLAN:  1. Obtain radiographs of the posterior ribs on the left.  2. Refill her Norco 10/325 1 p.o. q.4-6 h. not more than 5 per day,      #150, no refills.  She was also given samples of Lidoderm today      #10.  She will use on a p.r.n. basis.  3. Left posterior flank pain.  Etiology is not entirely clear.  Call      was placed to [name]' primary care physician, [name], was      made today and the case was discussed with him, and [name] plans      to follow up with him as well.   There is a possibility this may well be related to a thoracic nerve root  irritation.  However, we would like to rule out other causes prior to  making that particular resumption, may still consider MRI of the  thoracic spine at some point after [name] completes his workup.   [name] has expressed desire to possibly switch her medications, she  is requesting something stronger and has also talked to some friends and  has heard about tramadol and may be interested in trying that; however,  we will consider this in the upcoming week with her.  I will see her  back in a month.           ______________________________  Brantley Stage, M.D.     DMK/MedQ  D:  09/09/2007 09:37:19  T:  09/09/2007 22:25:27  Job #:  [id number]   cc:   [name], M.D.

## 2010-08-12 NOTE — Assessment & Plan Note (Signed)
[name] is a pleasant widowed 77 year old woman who is  accompanied by her daughter today.   She continues to have chronic shoulder pain and low back pain.  Her  average pain is about 8 on a scale of 10.  Pain is worse with activity  and improves with rest, heat, pacing her activities, medication, and  TENS unit.  She gets good relief with current medications.   Functional status is unchanged from previous visits.  She has limited  walking capacity not more than few minutes at a time, does not climb  stairs nor does she drive.  She continues to live independently, able to  feed and dress herself, as well as bathe and toilet herself.  She  occasionally need some assistance with heavier household tasks and meal  prep.   No changes with respect to review of systems today.  No changes in her  medical history.  She has been healthy over the last month.   PHYSICAL EXAMINATION:  VITAL SIGNS:  Blood pressure is elevated at  168/100, pulse 91, respiration 20, 93% saturated on room air.  GENERAL:  She is an elderly well-developed, well-nourished female who  does not appear in any distress.  She is oriented x3.  Speech is clear.  Affect is bright.  She is alert, cooperative, and pleasant.  Follows  commands without any difficulty.  Answers questions appropriately.   Cranial nerves and coordination are grossly intact.  Her reflexes are  diminished in the upper and lower extremities.  No abnormal tone is  noted.  No clonus is noted.  Sensation is intact.  She has significant  limitations in cervical range of motion in all planes, 60 degrees of  rotation to the right, 20 to the left.  Shoulder abduction is 30 degrees  bilaterally.  Motor strength is 5/5 without focal deficit today.  She  transitions from sitting to standing without pushing off with her upper  extremities.  She has a short stride length, but it is stable.  She does  use a cane; however, difficulty with tandem gait.  Romberg  test was  performed adequately.   IMPRESSION:  1. Cervical spondylosis.  2. Severe shoulder osteoarthritis bilaterally.  3. Bilateral knee osteoarthritis.  4. Status post total knee replacement on the left.  5. Lumbar spondylosis.  6. Probable thoracic spondylosis.   PLAN:  We will refill Percocet 5/325 one p.o. q.4-6 h, #150.  Request  her to follow up with primary care physician over at [facility] for hypertension.  We will see her back in a month.           ______________________________  Brantley Stage, M.D.     DMK/MedQ  D:  06/18/2008 14:08:16  T:  06/19/2008 02:44:02  Job #:  [id number]   cc:   [facility]  Fax: [phone]

## 2010-08-12 NOTE — Assessment & Plan Note (Signed)
[name] is a very pleasant 77 year old widowed woman who is followed  in our [facility]itative Clinic for multiple chronic pain  complaints including cervicalgia, bilateral shoulder pain, bilateral  knee pain, and low back pain.   She has significant osteoarthritis in multiple joints.   She was last seen by me on June 18, 2008.  In the interim, she has had  a nursing visit for refill of her medications.   At this time, she indicates her average pain is about 6 or 7 on a scale  of 10, worse with activities in general, improves with medications.   She continues to live independently.  She can ambulate up to 10 minutes  at a time.  She does use an assistive device in the community, has  difficulty with stairs, does not drive.  She is independent with  feeding, dressing, bathing, toileting, meal prep, and household duties.   No problems with respect to bladder control or bowel control problems.  She denies depression, anxiety, or suicidal ideation.  She reports no  new numbness, tingling or weakness.   Review of systems is otherwise unchanged.  She did see Dr. Evlyn Kanner who  drew some blood at the last visit to check thyroid functions apparently  and some other blood test which she did not know.   No other changes in past medical, social or family history.   Medications which are prescribed through this clinic include Percocet  5/325 up to 5 times per day.  The patient denies any problems with  oversedation, constipation, or gait instability from the medication.   Exam; blood pressure is 170/80, pulse 83, respirations 18, and 95%  saturated on room air.  She is a well developed, mildly obese elderly  female who does not appear in any distress.  She is smiling.  She is  oriented x3.  Speech is clear.  Affect is bright.  She is alert,  cooperative, and pleasant.  Follows commands without difficulty, answers  questions appropriately.   Cranial nerves are intact grossly.  Her  coordination is intact.  Reflexes are diminished in upper and lower extremities.  No abnormal  tone is noted.  No clonus is noted.  No tremors are appreciated.   Motor strength is in the 5/5 range without focal deficits in upper and  lower extremities.  Romberg test is performed adequately, difficulty  with tandem gait due to knee pain.  Limitations noted with respect to  cervical spine range of motion in all planes.  Lumbar motion shoulder  range is limited to 45 degrees of abduction.   IMPRESSION:  1. Cervical spondylosis, severe shoulder osteoarthritis bilaterally.  2. Bilateral knee osteoarthritis.  3. Status post total knee replacement on the left, lumbar spondylosis,      probable thoracic spondylosis.   PLAN:  Refill Percocet 5/325, not more than 5 times per day, #150.   [name] takes her medication responsibly.  She has not exhibited any  aberrant behavior with its use.  She reports overall fairly good relief  with the use of the medication.  She is able to continue to live  independently despite multiple orthopedic and osteoarthritic problems.   No significant side effects are appreciated with respect to constipation  over sedation, balance disorder.   I encouraged her to continue to stay as active as she can, by walking at  least 10 minutes each day.  I have asked her to start doing some walking  exercises to maintain range  of motion in her shoulders.  We will see her  back in 2 months, nursing visit next month for refill of medications and  pill count.           ______________________________  Brantley Stage, M.D.     DMK/MedQ  D:  08/24/2008 09:32:38  T:  08/24/2008 21:55:12  Job #:  [id number]   cc:   [facility]

## 2010-08-12 NOTE — Assessment & Plan Note (Signed)
[name] is an 77 year old widowed female who is accompanied by  her granddaughter this morning.  She is back in today for refill of her  pain medications.  She has significant osteoarthritis of multiple joints  including neck, shoulders, bilateral knees, low back.  She was last seen  by me on Aug 24, 2008.  In the interim, she has seen nursing staff for  refills of her pain medications and to monitor her narcotic use.   She is back in today and is complaining of more or so of her shoulder  pain and knee pain.  Pain is typically worse in the daytime when she is  up and moving around.  It improves with heat and her medications.   She sleeps well.  No problems with night pain.  Average pain during the  day is about 7 on a scale of 10, is described as aching in nature.   FUNCTIONAL STATUS:  She can walk 10 minutes at a time.  She can climbs  stairs.  She does not drive.  She lives independently and is able to  feed, dress, bathe, and toilet herself.  She is independent with meal  prep as well.   She admits to trouble walking due to knee pain predominantly.  No other  changes in past medical, social or family history since my last visit  with her in on Aug 24, 2008.   On initial presentation, her blood pressure was 124/102.  Repeated, it  was 162/84.  Her pulse was regular with occasional skipped beats noted.  Her respiratory rate was 18, 97% saturated on room air.  She did not  complain of any chest pain, shortness of breath, or nausea or  significant fatigue.   She is a well developed, well nourished elderly female, who does not  appear in any distress.  She is oriented x3.  Speech is clear.  Her  affect is bright.  She is alert, cooperative and pleasant.  Follows  commands without difficulty.  Answers my questions appropriately.  Cranial nerves and coordination are intact.  Reflexes are diminished in  upper and lower extremities.  No abnormal tone is noted.  No clonus is  noted.  No tremors are appreciated.  Her motor strength is rather good  in both upper and lower extremities without focal deficits.  She is able  to exit her chair independently, does not need to push off with her  upper extremity.  She has demonstrating good hip extensor strength.  Her  gait in the room is antalgic, short stride length is noted, complains of  knee pain while she walks.  She has difficulty with tandem gait, but  performs an adequate Romberg test.   Limitations are noted in cervical range of motion and shoulder range of  motion.  She is unable to abduct either shoulder more than about 45-50  degrees, limited of internal and external rotation are noted at each  shoulders.  She has tenderness over both knees and limitations in lumbar  motion as well as cervical range of motion.   IMPRESSION:  1. Cervical spondylosis.  2. Severe shoulder osteoarthritis bilaterally.  3. Bilateral knee osteoarthritis.  4. Status post knee replacement on the left.  5. Lumbar spondylosis.   PLAN:  Since she was noted to have a slightly irregular pulse, she seems  to be asymptomatic at this time.  I have called over to her primary care  practice in [place] and talked with Lillia Abed  there, who requested  that Ms. Huxtable stop by today for evaluation and possible EKG.  Her  Percocet was refilled for her today 5/325, not more than 5 tablets per  day every 4-6 hours.  She was given some lidocaine patches samples as  well #10.  She has been using these for quite a while.  She has had no  significant side effects with oxycodone.  No significant sedation,  constipation or gait instability from its use.  She does report she gets  fairly good relief with using the oxycodone and is able to continue to  live independently despite significant osteoarthritis in multiple  joints.   Ms. Soderberg has been taking Percocet as prescribed.  She does not exhibit  any aberrant behavior with this medication and her  pill counts are  appropriate.           ______________________________  Brantley Stage, M.D.     DMK/MedQ  D:  10/22/2008 11:44:57  T:  10/23/2008 02:12:27  Job #:  [id number]   cc:   [facility]

## 2010-08-12 NOTE — Assessment & Plan Note (Signed)
[name] is an 77 year old widowed female who is accompanied by her  daughter, [name].  She was last seen by me on November 23, 2007.  She  is being followed in our Pain and Rehabilitative Clinic for chronic pain  complaints related to severe bilateral shoulder osteoarthritis, as well  as bilateral severe knee osteoarthritis.  She does have a total knee  replacement on the left, however, the right knee bothers her quite a bit  when she is walking.  She has cervical spondylosis and lumbar  spondylosis and symptoms of spinal stenosis.   She is back in today for a refill of her medications.  At the last visit  she had been on hydrocodone 10/325.  It was reduced down to 7.5/325 so  that we could switch her over to Percocet today.  She states she feels a  bit worse being on the lower dose of the Norco from 10/325 down to the  7.5/325 tablets.   Average pain is about 9 on a scale of 10.  She sleeps fairly well with  thigh pain.  Activity is interfered with significantly, however, she has  started walking 10 minutes 3 times a day.  She feels that overall this  is helping her strength and endurance.  The pain is localized to  bilateral shoulders, knees, as well as the low back area.   FUNCTIONAL STATUS:  Ten minutes of walking 3 times a day.  She does not  climb stairs.  She does not drive.  She is independent with self-care.  She lives independently.  At this time, she does her own cooking,  bathing, and toileting.   Denies any new problems with respect to review of systems today.   No changes in past medical, social, or family history since she was seen  last on November 23, 2007.   PHYSICAL EXAMINATION:  VITAL SIGNS:  Today, blood pressure 118/87, pulse  86, respirations 16, and 99% saturated on room air.  GENERAL:  She is an elderly well-developed, well-nourished female who  does not appear in any distress.  She is oriented x3.  Speech is clear.  Affect is bright.  She is alert,  cooperative, and pleasant.  She follows  commands easily.  NEUROLOGIC:  Cranial nerves are grossly intact.  Coordination is intact.  Reflexes are diminished in the lower extremities at the patellar and  Achillis tendons.  Sensation is overall intact.  Motor strength is 5/5  at hip flexors, knee extensors, dorsiflexors, and plantar flexors .  Her  tone is within normal limits.  No clonus is noted.   Gait is assessed.  She has little bit of trouble going from a sit to  stand position suggesting some hip extensor weakness possibly, however,  her right knee may be bothering her quite a bit when she starts to put  weight to it as well.  Her gait is overall stable.  Tandem gait is not  assessed.  Romberg test is performed adequately, however.   IMPRESSION:  1. Severe bilateral shoulder osteoarthritis with limitations in range      of motion not more than 45 degrees of abduction.  2. Bilateral knee osteoarthritis with total knee replacement on the      left.  3. Cervical spondylosis with history of cervicalgia.  4. Lumbar spondylosis with lumbago and symptoms of lumbar spinal      stenosis.   PLAN:  I continued to encourage her to walk 3 times a day.  She does  have a home exercise program for lower extremity strengthening, which I  would like to review prior to her starting on that.  We will switch her  today from Norco 7.5/325 every 4 to 6 hours not more than 5 tablets per  day to Percocet 5/325 one p.o. every 4 to 6 hours not more than 5 per  day, 2 weeks supply, #75.  I will see her back in 2 weeks.  Monitor her  for oversedation or constipation, any other side effects from the  Percocet as well.  She will give a call if she is having problems with  it.  She currently has been stable on pain medications prescribed by this  clinic and continues to function well, living independently as an 76-  year-old in her own home.  I will see her back within 2 weeks.            ______________________________  Brantley Stage, M.D.     DMK/MedQ  D:  12/07/2007 09:43:48  T:  12/07/2007 23:27:41  Job #:  [id number]

## 2010-08-12 NOTE — Assessment & Plan Note (Signed)
[name] is an 77 year old widowed female who is accompanied by her  daughter and followed in our pain and rehabilitation clinic for multiple  pain complaints including bilateral shoulder pain, cervicalgia, knee  pain, low back pain.   She has significant osteoarthritis.   Her average pain is about 7 on a scale of 10.  She describes her pain as  dull and aching for the most part, worse when she is active, improves  with rest and heat and medications.  She is getting fair to good relief  with current meds that are prescribed by our clinic.   Pain interferes significantly with activity and enjoyment of life.   FUNCTIONAL STATUS:  Patient can be up for a few minutes at a time.  She  is able to get out in the community a little bit with her daughter as  well.  She has difficulty with stairs, does not drive.  She is  independent with her self-care, however is able to make light meals and  do some light household tasks.   She has control of bowel and bladder.  Denies depression, anxiety, or  suicidal ideation.  Denies any problems with  _________  review of  systems.   PAST MEDICAL, SOCIAL, AND FAMILY HISTORY:  Unchanged since her last  visit.   MEDICATIONS:  Medications provided by our clinic include:  1. Protonix 40 mg 1 p.o. daily.  2. Norco 10/325, not more than 5 per day.  3. She also takes p.r.n. ibuprofen 400 mg in the morning.  4. She uses Biofreeze p.r.n. as well.   Other medications she takes include lisinopril, triamterene, TriCor,  Norvasc, senna C, and fish oil.   No changes in past medical, social, or family history since her last  visit.   ALLERGIES:  SHE GETS EDEMA WITH NEURONTIN.   PHYSICAL EXAMINATION:  Blood pressure is 152/78, pulse 90, respirations  18, 99% saturation on room.  She is well-developed, well-nourished  female who does not appear in any distress.  She is oriented x3.  Her  speech is clear.  Affect is bright.  She is alert, cooperative, and  pleasant and she follows commands without difficulty.  She is able to  transition from sitting to standing slowly.  Her gait in the room is  stable.  She has some difficulty with tandem gait.  Romberg test is  performed adequately.  She has limitations in cervical range of motion.  She has limitations in shoulder range of motion bilaterally, not able to  abduct much more than 50 degrees bilaterally.  She has limitations in  lumbar motion as well in all planes.   Reflexes are diminished in the upper and lower extremities.  Her motor  strength is quite good, however.  No sensory deficits are appreciated.  She has normal tone and no clonus is noted.   Coordination is grossly intact.   IMPRESSION:  1. Bilateral knee osteoarthritis with total knee replacement on the      left.  2. Cervical spondylosis with cervicalgia.  3. Lumbar spondylosis with lumbago.  4. Severe shoulder osteoarthritis.   PLAN:  We refilled her Norco today 10/325, one p.o. q.i.d. q.4-6h., not  more than 5 tablets per day.  She also is using Biofreeze which she uses  on a p.r.n. basis.  She has been taking her medications as prescribed,  does not display any aberrant behavior, and is able to maintain  relatively functional lifestyle and is currently living independently  despite multiple joints involved with osteoarthritis and significant  pain complaints.  We will see her back in a month.  She is stable on the  above medications.  No aberrant behavior has been appreciated.  She  takes her medications as prescribed and is getting fair to good relief  with them.           ______________________________  Brantley Stage, M.D.     DMK/MedQ  D:  05/06/2007 14:16:13  T:  05/07/2007 23:52:12  Job #:  [id number]

## 2010-08-12 NOTE — Assessment & Plan Note (Signed)
HISTORY OF PRESENT ILLNESS:  [name] is an 77 year old widowed female  who is accompanied by her daughter to our pain and rehabilitative clinic  for refill of medications. She has been seen in our clinic for multiple  pain complaints including bilateral shoulder pain, cervicalgia, knee  pain, and low back pain.   Her average pain is between a 7 and 8 on a scale of 10. Her pain is  described as constant aching, dull, stabbing, sharp and burning in  nature. Significantly interfering with her activity level. The pain is  worse toward the end of the day. Sleep is fair. Pain is worse generally  with activities. Improved with rest, heat, medications, as well as a  Tens unit.   She gets good relief with current medications that she has been  prescribed.   She currently takes Norco 10/325 up to 5 tablets per day.   FUNCTIONAL STATUS:  She ambulates independently. She does not drive. She  does not climb stairs. She lives alone and is independent with feeding,  dressing, bathing and toileting. She is able to make small meals.  Currently, this month she made some banana pudding as well as some Jello  and she uses the microwave oven as well.   She has help with grocery shopping and cleaning.   PAST MEDICAL HISTORY/SOCIAL HISTORY/FAMILY HISTORY:  No changes.   REVIEW OF SYSTEMS:  Noncontributory.   MEDICATIONS:  Provided by our clinic include:  1. Protonix 40 mg 1 p.o. daily.  2. Norco 10/325, up to 5 times a day.  3. Lidoderm patches on a p.r.n. basis.  4. She also uses Bio-freeze.   PAST MEDICAL HISTORY:  Include a history of thyroid problems,  hypertension, and hyperlipidemia.   PHYSICAL EXAMINATION:  VITAL SIGNS:  Blood pressure 150/60, pulse 87,  respiratory rate 18, 96% saturated on room air.  GENERAL:  A well developed, elderly female who appears her stated age  and does not appear in any distress.  NEUROLOGIC:  Oriented x3. Speech is clear. Affect is bright. She is  alert,  cooperative and pleasant. She follows commands without  difficulty.   She transitions from sitting to standing slowly. Her gait in the room is  stable. Tandem gait, Romberg's test are not tested due to instability  concern, however.   She has limitations in cervical range of motion. She has limitations in  shoulder range of motion. Crepitus is noted bilaterally at the  shoulders, as well as at the elbows and the wrists.   She has discomfort with palpation over the knees.   Reflexes are diminished overall. Strength is in the good range without  focal deficits. She has no abnormal tone or clonus.   IMPRESSION:  1. Bilateral knee osteoarthritis with total knee replacement on the      left.  2. Cervical spondylosis with cervicalgia.  3. Lumbar spondylosis with lumbago.  4. Severe shoulder osteoarthritis with limitations in shoulder motion.   PLAN:  1. Will give her samples of Lidoderm again today.  2. She understands how to use this and has been using them for many      months now.  3. Will refill her Norco 10/325 1 p.o. q.4 to 6 hours but not more      than 5 tablets per day, #150 with no refills.  4. She also will continue to use Bio-freeze on a p.r.n. basis.   [name] takes her medications as prescribed. She has not displayed any  aberrant behavior with her use and she is able to continue to live  independently and maintain a functional lifestyle, despite significant  functional impairments.           ______________________________  Brantley Stage, M.D.     DMK/MedQ  D:  06/03/2007 14:17:35  T:  06/03/2007 20:37:09  Job #:  [id number]

## 2010-08-12 NOTE — Assessment & Plan Note (Signed)
[name] is accompanied by her daughter to our Pain and  Rehabilitative Clinic today for refill of her medications.  She is the  patient of Dr. Caryl Never.   [name] was last seen on February 15, 2008.  At the last visit, she  had had a fall 6 days prior to coming in to our clinic and was still  complaining of hip pain and was noted to have significant bruising over  her both right and left lateral thigh.   Radiographs of her hips were ordered and the results are attached to  chart today.  Radiographs done on February 17, 2008, showed no skeletal  abnormalities, well-preserved joint space in both hips, degenerative  changes of the sacroiliac joint and lower lumbar spine with mild  osteopenia.  The results of this were reviewed with her and her daughter  today in clinic.   She states that over the last several weeks she has been feeling much  better.  Her pain is completely dissipated.  Her average pain is about 7  on a scale of 10 whereas at the last visit it was 10 on a scale of 10.   She sleeps well.  Pain is typically worse with activities and while  standing and walking; improve with rest, heat, medications, TENS units,  and exercise.   She reports good relief with current medications provided to this  clinic.   MEDICATIONS:  From this clinic include Percocet 5/325, not more than 5  tablet per day.   She can walk 10 minutes at a time.  She does not climb stairs nor does  she drive.   She is independent with self-care.  Currently lives independently.   REVIEW OF SYSTEMS:  Noncontributory.  No problems controlling bowel or  bladder.  No new problems with weakness, numbness.  No problems with  depression, anxiety, or suicidal ideation.   LABORATORY DATA:  She did bring in some blood work, which Dr. Caryl Never  had ordered.  This was attached to the chart as well showing a serum  creatinine of 1.4, and BUN of 33.   No other change in past medical, social, or family  history since last  visit.   PHYSICAL EXAMINATION:  VITAL SIGNS:  Blood pressure is 140/90, pulse 86,  respiration 18, and 95% saturated on room air.  GENERAL:  She is a well-developed, well-nourished woman who does not  appear in any distress.  She appears her stated age.  She is oriented  x3.  Her speech is clear.  Her affect is bright.  She is alert,  cooperative, and pleasant.  She follows commands without difficulty.  She answers questions appropriately.  NEUROLOGIC:  Cranial nerves are remarkable for mild decreased hearing,  otherwise intact.  Coordination is intact.  Reflexes are diminished in  both upper and lower extremities.  No abnormal tone is noted.  No clonus  is noted.  No tremors are noted.  Motor strength is 5/5 without focal  deficit.  There are no new sensory deficits appreciated.  She has significant limitations and shoulder range of motion  bilaterally.  No tenderness is noted with palpation over the lower  lumbar segment over the posterior pelvic region.  No bruising, which was evident at last visit has dissipated.   IMPRESSION:  1. Status post fall approximately 5 weeks ago with resolution of      lateral hip pain and low back pain with negative radiographs for  fracture in lumbar spine and negative for fracture in bilateral      hips.  2. Severe bilateral shoulder osteoarthritis.  3. Bilateral knee osteoarthritis with a total knee replacement on the      left.  4. Cervical spondylosis with intermittent cervicalgia.  5. Lumbar spondylosis with lumbago and symptoms of spinal stenosis.   PLAN:  Refill her medications today, 5/325 Percocet 1 p.o. q.4-6 h., not  more than 5 tablets per day, #150.   [name] has been taking her medications as prescribed, she does not  exhibit any aberrant behavior with their.  She is able to continue to  maintain an independent and functional lifestyle despite significant  pain and functional impairment.   We will see her  back in 1 month.           ______________________________  Brantley Stage, M.D.     DMK/MedQ  D:  03/07/2008 09:35:48  T:  03/07/2008 21:25:22  Job #:  [id number]   cc:   Evelena Peat, M.D.

## 2010-08-12 NOTE — Assessment & Plan Note (Signed)
[name] is an 77 year old widowed female who is accompanied by her  daughter today for a brief recheck.  She was seen by [place] on October 10, 2007. At that time, she was switched over from hydrocodone to tramadol.   She states 2 days ago, she had some mild diarrhea and some nausea;  however, has recovered from this and overall feels the tramadol is more  helpful in treating her pain and would like a prescription for a full  month of tramadol.   Average pain is about 7 on a scale of 10 today.  Pain is worse with  activities, improves with meds.  She is getting good relief from the  medicines at this time.  While she was on hydrocodone, she reported fair  relief.   Her ambulation is limited to just a few minutes.  She is independent  with her self care.  Denies problems controlling bowel or bladder.  Denies suicidal ideations.  She has no continued problems with nausea,  vomiting, or diarrhea.  No problems with constipation.  No abdominal  pain.   Review of systems is essentially negative at this time.   No other changes in past medical, social, or family history since last  visit.   Exam, blood pressure  is 138/70, pulse is 97, respirations 16, and 98%  saturated on room air.   [name] is a well developed, well nourished, elderly female who is  smiling and appears in no distress.  She is oriented x3.  Her speech is  clear.  Her affect is bright.  She is alert, cooperative, and pleasant.  She transitions from sitting to standing without difficulty.  Cranial  nerves are grossly intact.  Motor strength in knee, upper, and lower  extremities is in 5/5 range.  Coordination is intact.  She has some  difficulty with tandem gait.  The Romberg test is performed adequately.  Reflexes are diminished in the lower extremities and there are no  sensory deficits in the lower extremities.   IMPRESSION:  1. Low back pain, lumbar spondylosis with symptoms of spinal stenosis.  2. Cervical  spondylosis with cervicalgia.  3. Bilateral shoulder osteoarthritis with imitations in range of      motion.  4. Bilateral knee osteoarthritis, status post total knee replacement      on the left.   PLAN:  We will give her a monthly prescription of tramadol 50 mg 1 p.o.  q.4-6 h. p.r.n. back, shoulder, or leg pain, not more than 5 tablets per  day, #120, and no refills.  We will see her back in a month.  She  appears to be doing quite well on this medication.  This week, she will  let us know if she has any trouble with it.  There is no evidence of  oversedation or constipation, dizziness, or new incoordination.  We will  see her back in a month.           ______________________________  Brantley Stage, M.D.     DMK/MedQ  D:  10/14/2007 13:28:15  T:  10/15/2007 05:44:24  Job #:  [id number]

## 2010-08-12 NOTE — Procedures (Signed)
NAME:  [name], [name]                 ACCOUNT NO.:  [id number]   MEDICAL RECORD NO.:  [id number]          PATIENT TYPE:  REC   LOCATION:  [facility]                          FACILITY:  [facility]   PHYSICIAN:  Erick Colace, M.D.DATE OF BIRTH:  09-May-1917   DATE OF PROCEDURE:  08/29/2007  DATE OF DISCHARGE:                               OPERATIVE REPORT   PROCEDURE:  L4-5 right paramedian translaminar lumbar epidural steroid  injection under fluoroscopic guidance.   INDICATIONS:  Lumbar radicular pain bilateral lower extremities.  Pain  only partially relieved with medication management.  She has had prior  lumbar injections per Dr. Ethelene Hal with improvements in the past.   Informed consent was obtained after describing risks and benefits of the  procedure to the patient including bleeding, bruising, infection and  paralysis.  She elects to proceed and has given written consent.  Her  pain does interfere with mobility and self-care.   The patient placed prone on fluoroscopy table, Betadine prep, sterile  drape, 25-gauge inch and a half needle was used to anesthetize the skin  and subcu tissue 1% lidocaine x2 mL and an 18-gauge Hustead needle was  inserted in the right L4-5 interlaminar space.  AP and lateral imaging  utilized.  Loss-of-resistance technique used after bone contact was made  with the inferior aspect the L4 lamina.  Needle redirected inferiorly,  loss resistance obtained, confirmed with 1 mL of Omnipaque 180  demonstrating good epidural spread, followed by injection of 2 mL of 1%  MPF lidocaine and 1.5 mL of 40 mg/mL Depo-Medrol.  The patient tolerated  the procedure well.  Pre and post injection vitals stable.  Post  injection instructions.  Pre injection back pain at a 9, post injection  0.  Pre-injection leg pain 9, post injection 7.  Will follow up with Dr.  Pamelia Hoit to assess for repeat injection.      Erick Colace, M.D.  Electronically Signed     AEK/MEDQ   D:  08/29/2007 13:49:57  T:  08/29/2007 14:32:07  Job:  [id number]

## 2010-08-12 NOTE — Assessment & Plan Note (Signed)
[name] is an 77 year old widowed female who is being seen in our pain  and rehabilitative clinic for chronic pain complaints in her neck and  shoulders, low back.   She was accompanied by her daughter, and was last seen by me on  December 02, 2006.   In the interim, she has seen Dr. Evlyn Kanner who has ordered a scan on her  thyroid, and she will be following back up with him in the upcoming  weeks.  Apparently, he has also checked some vitamin levels.   She also saw Dr. Caryl Never for some flank pain that she had, and she  states that this pain has significantly subsided.   Her average pain is about a 6 on a scale of 10, and, in fact, over the  last month she has decreased her narcotic use, her opioid use, from 5  pills a day down to 4 pills a day.  Her pain scores last month were  about a 7 or an 8.  They are down to a 6 this month.  Pain is typically  located in the posterior cervical region as well as the shoulders and  lower back, and in the thoracic region as well.   Pain improves typically with rest and heat, medications.  She gets fair  relief with the medications prescribed by this clinic.  Medications from  this clinic include Norco 10/325 one p.o. q.4-6 h. #150 per month.   Functional status:  The patient is able to walk about 5 minutes at a  time.  She does not climb stairs nor drive.  She is independent with her  self care.  She does need help with higher level household activities,  shopping; however, she is able to do some simple meal prep.   Denies depression, anxiety, suicidal ideation, denies problems  controlling bowel or bladder, admits to some constipation which is  alleviated by taking Senokot.   No changes, otherwise, in the past medical, social or family history.   PHYSICAL EXAMINATION:  Blood pressure is 155/64, pulse 86, respirations  18, 97% saturated on room air.  She is a well-developed, well-nourished female who appears her stated  age.  She is  oriented x3.  Her speech is clear, her affect is bright,  alert, cooperative, pleasant.  She follows commands without difficulty.  She transitions from sitting to standing easily.  Gait in the room is  stable, narrow based, slightly short stride length bilaterally, however.  She has limitations in the cervical range of motion, especially rotation  to the left, also limited somewhat to the right.  Shoulders are limited  to not more than 50 to 60 degrees of abduction bilaterally.  She has  limitations in lumbar motion as well.  Reflexes are diminished in the lower extremities.  They are 1+ in the  upper extremities at biceps, triceps, brachioradialis.  No abnormal tone  is noted in the upper or lower extremities.  Motor strength is in the  5/5 range.  No focal weakness is appreciated.   IMPRESSION:  1. Cervical spondylosis/stenosis with cervicalgia.  2. Lumbar spondylosis with intermittent lumbago.  3. Bilateral knee osteoarthritis.  4. Severe bilateral shoulder osteoarthritis.   Overall improvement in her left parascapular pain.  May consider  thoracic radiographs if she continues to have a problem with this,  looking for a possible compression.   PLAN:  Refill her Norco 10/325 one p.o. q.i.d. and h.s. #100.  She has  53 pills left over from  last visit.  She has taken her medications  appropriately and is able to maintain functional independent living  despite the significant limitations in her joints and pain.  We will see  her back in a month.           ______________________________  Brantley Stage, M.D.     DMK/MedQ  D:  01/05/2007 08:59:20  T:  01/05/2007 12:02:47  Job #:  [id number]   cc:   Evelena Peat, M.D.

## 2010-08-12 NOTE — Assessment & Plan Note (Signed)
NOTE:  [name] is a pleasant 77 year old widowed female who is being  seen in our pain and rehabilitation clinic for multiple chronic pain  complaints.  She is accompanied by her daughter today, she was last seen  by me on October 06, 2006, and has had a nursing visit on November 04, 2006,  for refill of her medications.   She is back in today.  States her average pain is about a 7 on a scale  of 10.  Pain interferes significantly with activity, enjoyment of life,  and relation with others.  Pain is typically throughout the day, worse  with standing, bending, sitting and activity, improves with rest, heat,  therapy, medications, TENS unit.   Pain is localized predominantly to the shoulders bilaterally and to the  bicipital and deltoid region bilaterally.  She does complain of a new  flank-type pain located just under her left scapula in a broad area  under the scapula just to the left of her spine between T6 and T9.   She states that this came on over the last week or so, seems to be  associated with some shortness of breath.  It gets better if she lies  down and it is worse the longer she stands up.   She has limitations in ambulation.  She is unable to verbalize how long  she can stand up at one time.  She does use a cane as well as a walker.  She does not drive.  She does not climb stairs.  She is living by  herself but has a good deal of family support.  She requires occasional  assistance with dressing, meal prep, household duties but is independent  with feeding, bathing as well as toileting.   REVIEW OF SYSTEMS:  Otherwise noncontributory.  No changes in appetite.  No problems with constipation or diarrhea.  Intermittent shortness of  breath is noted.  No coughing.  No increased swelling.  No fevers or  chills.  No problems with urination.  No changes in stool habits.   She reports no other changes in past medical or surgical history since  our last visit.  No changes in social or  family history.   Medications provided by this clinic include Norco 10/325 up to five  times a day p.r.n. joint pain.   She also takes over-the-counter ibuprofen 400 mg each morning and  Protonix 40 mg one p.o. daily.   PHYSICAL EXAMINATION:  VITAL SIGNS:  On exam her blood pressure is  151/75, pulse 90, respirations 18, saturation 98% on room air.  GENERAL:  She is a well-developed, well-nourished female who does not  appear in any distress.  She is oriented x3.  Her speech is clear.  Her  affect is bright.  She is alert, cooperative and pleasant.  She follows  commands without difficulty.  She transitions from sitting to standing  slowly.  Her gait in the room is slow but stable.  Tandem gait is not  assessed.  Romberg test is performed adequately, however.  She has  significant limitations in cervical range of motion, about 10 degrees of  motion is noted to the left, with rotation about 40 degrees to the  right.  Shoulders are limited to not more than 30-40 degrees of  abduction bilaterally, and this is with some discomfort as she does  this.  Forward flexion is approximately 45 degrees bilaterally.  She has  limitations in lumbar motion but is not particularly  painful with these  activities in particular.  Reflexes are diminished overall.  Motor  strength, however, is good.  No focal weakness is appreciated.  She has  good strength within her range of motion in her upper extremities.  Hip  flexors, abductors and adductors are in the 5/5 ranges.  Knee extensors  and flexors are 5/5.  Dorsi- and plantar flexors are 5/5.  EHL was not  tested specifically today.  No costovertebral angle tenderness was noted  with percussion today.  There was no spinal tenderness over the spinous  processes from the cervical region through the lumbar region.  Pressure  over the ribs did not elicit any increased pain, in fact, the left flank  pain she has described is really not a problem for her at this  time.  She says it does come on when she has been standing for a while and is  associated with some shortness of breath.  However, this was not  observed during this particular clinic visit.   IMPRESSION:  1. New left flank pain, etiology not clear.  I have asked her to      follow up with Dr. Caryl Never at [facility] for      further evaluation.  2. Cervical spondylosis/stenosis with cervicalgia.  3. Lumbar spondylosis with intermittent lumbago.  4. Bilateral knee osteoarthritis.  5. Severe bilateral shoulder osteoarthritis with limitations in motion      and significant pain.   PLAN:  Ms. Bond continues to use Lidoderm on a p.r.n. basis as well as  a soft collar and a TENS unit to help her with her pain.  She does take  400 mg of ibuprofen in the morning, has not had any problems with  respect to her stomach, nausea or abdominal pain, from this.  She also  continues to use Norco 10/325 up to five times per day to help manage  her joint pain.   She has not displayed any aberrant behavior with her narcotic use.  She  uses it as directed and is able to maintain independent lifestyle  despite multiple pain complaints and joint limitations.  We will see her  back in a month.  Her daughter is going to call Dr. Caryl Never for an  appointment to evaluate her left intermittent flank pain in more detail.           ______________________________  Brantley Stage, M.D.     DMK/MedQ  D:  12/02/2006 10:05:02  T:  12/02/2006 11:00:50  Job #:  [id number]   cc:   Evelena Peat, M.D.

## 2010-08-12 NOTE — Assessment & Plan Note (Signed)
[name] is an 77 year old widowed female who is accompanied by her  daughter today to our [facility] for a refill of her  pain medications.   She was last seen by me on June 03, 2007.  In the interim she has not  had any intervening medical problems.  She continues to live  independently and is able to make small meals, is independent with all  of her self care.   She has complaints of pain in her shoulders, her neck, her knees and low  back.  States her average pain is about a 8 on a scale of 10.  She gets  fair relief with the pain medication provided by our clinic.  Pain is  described as constant, dull, stabbing, aching in nature.  Worse  typically in the morning, although she does have discomfort at various  times of the day depending on her activity level.   Functional status:  She can be up for short periods of time, 15, 20  minutes.  She does not climb stairs.  She currently does not drive.   REVIEW OF SYSTEMS:  Negative for any new problems.   PAST MEDICAL/SOCIAL/ FAMILY HISTORY:  Also noncontributory today.   MEDICATIONS PROVIDED BY OUR CLINIC:  1. Protonix 40 mg 1 p.o. daily.  2. Norco 10/325 up to 5 times daily.  3. Lidoderm patches on a p.r.n. basis and she also uses Biofreeze.   EXAMINATION:  Blood pressure 134/80, pulse 91, respirations 20, 98%  saturated on room air.  She is a well developed, well nourished, elderly  female who does not appear in any distress.  She is oriented x3.  Speech is clear.  Her affect is bright.  She is  alert, cooperative and pleasant, and she follows commands easily.  Transitioning from sitting to standing is done slowly.  Her gait is  careful in the room.  No antalgia is appreciated.  She has a rather  short stride length bilaterally and slightly wide-based gait.  She has limitations in cervical range of motion, shoulder range of  motion and lumbar range of motion.  Reflexes are diminished in the upper and lower  extremities.  No abnormal  tone is noted.  No clonus is noted.  Motor strength is good within her  range of motion.   IMPRESSION:  Bilateral knee osteoarthritis with total knee replacement  on the left.  1. Cervical spondylosis with cervicalgia.  2. Lumbar spondylosis with lumbago.  3. Severe shoulder osteoarthritis with limitations in shoulder motion.   PLAN:  1. She was given Lidoderm today, #8.  2. Also refilled her Protonix 40 mg 1 p.o. daily #30 with 3 refills.  3. Norco 10/325 one p.o. every 4 to 6 hours p.r.n. back or neck pain,      not more than 5  per day, #150 per month.   I will see her back in a month.  She has been stable on the above  medications.  She does not have any trouble with over sedation or  constipation, and she is able to continue to live independently and  mange her activities of daily living independently.           ______________________________  Brantley Stage, M.D.     DMK/MedQ  D:  07/04/2007 14:16:09  T:  07/04/2007 14:57:34  Job #:  [id number]

## 2010-08-12 NOTE — Assessment & Plan Note (Signed)
[name] is a very pleasant 77 year old widowed woman who is  accompanied by her daughter.  She is being followed in our [facility] for multiple chronic pain complaints.  She uses  hydrocodone/acetaminophen up to 5 times a day and Lidoderm p.r.n.  Her  pain is related to bilateral shoulder osteoarthritis, lumbago, lumbar  spondylosis with symptoms of spinal stenosis including lower extremity  intermittent pain.   At the last visit, she had some complaints of some left flank pain with  some rib tenderness.  She was followed up by her primary care physician,  Dr. Caryl Never, who ordered some blood work as well as a chest x-ray, and  [name] brings in the results of these to attach to our chart today.  Chest x-ray showed no acute findings, some cardiac enlargement without  evidence of heart failure, as well as degenerative changes noted in the  glenohumeral joints and torturous and unfolded thoracic aorta.   Blood work is attached to chart for review as well.   She reports overall that the left flank pain is better.  Her chief  complaint today is bilateral shoulder pain.   Her daughter had some concerns about Ms. Treiber climbing up on ladders  today, apparently she was hanging some things off her porch and the  daughter had some concerns about her safety doing this type of activity.   [name]' average pain is about 7 on a scale of 10.  Pain is typically  worse with movement.  Sleep is good.  Pain improves with rest, heat,  TENS unit, medication, therapy, and injections.   She is getting fair relief with current medications that are prescribed.   Her mobility is limited to not more than a few minutes up at a time.  She does not climb stairs nor does she drive.  She does use a cane and  occasionally a walker and she is able to walk without assistance.  She  typically will use assistive devices on uneven surfaces.   She is independent with all of her self care.   She currently lives  independently.   Review of systems is negative.  No changes in past medical, social, or  family history other than that already noted.   PHYSICAL EXAMINATION:  VITAL SIGNS:  Blood pressure today is 138/68.  GENERAL:  Ms.  Dunn is a thin adult female who does not appear in any  distress.  She is oriented x3.  Speech is clear.  Affect is bright.  She  is alert, cooperative, and pleasant.  NEUROLOGIC:  Her cranial nerves are grossly intact.  Her strength is in  the 5/5 range in the lower extremities.  She has some strength deficits  around the proximal upper extremities, especially shoulder abductors  namely, and she is unable to give full strength due to shoulder pain.  Her reflexes are diminished throughout both upper and lower extremities.  No abnormal tone is noted.  No clonus is noted.  Sensation is intact.  Coordination is grossly intact.  She is able to transfer from sitting to  standing without difficulty, although it is somewhat slow.  Her gait is  stable in the room.  Tandem gait, she does have some difficulty with,  and with Romberg she is able to maintain her balance.  She does have a  little bit of sway, however.  No pronator drift is appreciated.  MUSCULOSKELETAL:  Diminished range of motion in bilateral shoulders with  internal rotation as well as abduction.  She has pain with these  movements as well.  No tenderness is noted with palpation throughout the  cervical, thoracic, or lumbar spine.  No tenderness in the paraspinal  musculature as well today.  Mild tenderness along bilateral knee joints.  Hip joints are functional, range of motion without pain with internal or  external rotation.   IMPRESSION:  1. Overall resolution of left flank pain.  2. Lumbar spondylosis with lumbago and symptoms of spinal stenosis.  3. Cervical spondylosis with cervicalgia.  4. Severe bilateral shoulder osteoarthritis with limitations in      shoulder range of  motion.  5. Bilateral knee osteoarthritis with total knee replacement on the      left.   PLAN:  Blood work and chest radiographs were reviewed, which were  ordered by Dr. Caryl Never.  [name] has expressed the desire to switch  her medications.  She has talked to some of her friends who apparently  are using tramadol and is interested in trialling this medication.   We reviewed the risks and benefits of this medicine as compared to  hydrocodone.  She understands that there may be some difficulty  transitioning off hydrocodone and on to tramadol.  We will see her back  in 4 days to monitor her transition.   We will write a prescription for her today for tramadol 50 mg one-half  tablet to one-full tablet up to 4-6 times per day, and we will see her  back on Friday morning, which is in 4 days. At that point, we will  reassess and see if she would like to continue using tramadol or switch  back to her hydrocodone.  I have discussed with her that she may  experience some symptoms of withdrawal and I described the symptoms for  her and her daughter.   Tramadol 50 mg one-half tablet to one-full tablet p.o. q.4-6 h. p.r.n.  pain #50 written out today, no refills.  I also gave her some samples of  Lidoderm #8.  She has been using this patch for many months now and  knows how to use it.  She will let us know if there are any problems  with transitioning her medicines.           ______________________________  Brantley Stage, M.D.     DMK/MedQ  D:  10/10/2007 10:31:27  T:  10/11/2007 07:30:31  Job #:  [id number]
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
## 2010-08-12 NOTE — Assessment & Plan Note (Signed)
[name] is an 77 year old widowed female who is accompanied by  her daughter, [name], this morning.  She was last seen by me on October 14, 2007.  At that time, she had been switched over from hydrocodone to  tramadol.  She had been on tramadol for several days and she reported  she felt better and was doing quite well on tramadol.  Over the last  week, she states she has developed some intermittent diarrhea and feels  that the tramadol is not as beneficial in treating her pain as the  hydrocodone was.  Initially, she felt that her shoulder pain was much  better controlled.  However, she is not so sure.  She is back in today  because she has had some intermittent diarrhea, which has not been  persistent.  Her daughter is concerned that she is having this diarrhea.   [name] reports her average pain to be 7-8 on a scale of 10, which is  still an overall improvement from her reports on her hydrocodone.  She  states that she is having trouble getting up and being more active,  however, in the last day or so.  The pain is typically worse with almost  every activity including inactivity.  Her pain does improve with rest,  heat, injections, TENS unit, and medications.  She reports fair-to-good  relief with her current medications at this time.   Medications provided by this clinic currently include tramadol 50 mg 1  p.o. q.4-6 h. not more than 5 tablets per day and p.r.n. Lidoderm.   She lives independently.  She is independent with feeding, bathing,  dressing, and toileting.  She can be up walking not more than 5 minutes  at a time.   She denies problems controlling bowel or bladder.  She does report some  loose stools over the last couple of days, one loose stool today, 2 to 3  loose stools yesterday, and about 3 loose stools the day before.  Prior  to that, she did not have difficulty with any type of diarrhea.   Past medical, social, and family history are otherwise  unchanged.   PHYSICAL EXAMINATION:  Her blood pressure is 136/78, pulse 82,  respirations 20, and 98% saturated on room air.  She is a well-  developed, well-nourished female who does not appear in any distress.  She is oriented x3.  Her speech is clear.  Her affect is bright.  She is  alert, cooperative, and pleasant.   Her mucous membranes reveal well-hydrated individual.  They do not  appear dry, pink and moist.  Cranial nerves are intact.  Coordination is  intact.  Reflexes are diminished in the lower extremities.  No abnormal  tone is noted.  No clonus is noted.  There is no new sensory deficits.  Transitioning from sitting to standing, she does appear to be more  uncomfortable than at the last visit.  Her gait is a bit more antalgic  today.  She has some mild tenderness over the right knee today.  No  effusion is noted.  She has full range of motion at that joint.  She has  some tenderness in the lumbar paraspinal muscles, which is not new.   Shoulders are limited to 45 degrees of abduction, which is normal for  her.   IMPRESSION:  1. Increased shoulder and back and knee pain, status post switching      from Norco to tramadol.  2. Symptoms  of mild diarrhea over the last couple of days.  3. Cervical spondylosis with history of cervicalgia.  4. Lumbar spondylosis with lumbago and symptoms of spinal stenosis.  5. Severe bilateral shoulder osteoarthritis with limitations in range      of motion.  6. Bilateral knee osteoarthritis with total knee replacement on the      left.   We will check her blood work today and send copies over to her primary  care physician as well.  We will switch her back to Norco.  She will  discontinue tramadol today as she will be placed back on her Norco  10/325 one p.o. q.4-6  h. p.r.n. knee or shoulder pain, #150, no refills.  I will see her back  in a month.  I have asked her to follow back up with her primary care  Dr. Caryl Never should she  continue to have problems with diarrhea.           ______________________________  Brantley Stage, M.D.     DMK/MedQ  D:  10/21/2007 13:02:40  T:  10/22/2007 02:15:47  Job #:  [id number]

## 2010-08-12 NOTE — Assessment & Plan Note (Signed)
[name] is a pleasant 77 year old widowed woman who is  accompanied by her daughter to our [facility].  She  is here for a refill of her pain medications and a brief recheck.  She  is a patient of Dr. Caryl Never.   [name] has chronic pain related to severe bilateral shoulder  osteoarthritis, knee osteoarthritis with a total knee replacement on the  left, and significant cervical spondylosis and lumbar spondylosis with  mild symptoms of spinal stenosis.   She was last seen by me on April 11, 2008.  In the interim, she states  she had been healthy, has not had any slips or falls.  She has not had  any visits to any other doctors.  Her average pain is about 8 on a scale  of 10, predominantly in the cervical and shoulder region as well as  bilateral knees.   Pain is described as constant, worse when she is up walking and active,  improves with rest and medication.   She gets fair relief with current meds prescribed through this clinic.   Medications from this clinic includes Percocet 5/325 up to 5 times per  day on a p.r.n. basis.   Other medications include lisinopril, triamterene/hydrochlorothiazide,  TriCor, Norvasc, Senna-C, and fish oil.  These are prescribed by Dr.  Caryl Never.   Functional status is as follows.  She is able to walk between 5 and 10  minutes at a time.  She does not climb stairs nor drive.  She is  independent with self-care and currently lives independently.   REVIEW OF SYSTEMS:  Negative for bowel or bladder control problems.  Denies depression or anxiety.  Denies harm to self or others.  Denies  any problems with numbness, weakness, or tingling.   Occasional constipation is controlled with Senokot.   No changes in past medical, social or family history since last visit.   PHYSICAL EXAMINATION:  Blood pressure is 130/84, pulse 85, respiration  18, 100% saturated on room air.  [name] is a well-developed, well-  nourished,  mildly obese elderly female who does not appear in any  distress.  She is oriented x3.  Her speech is clear.  Her affect is  bright.  She is alert, cooperative, and pleasant.  She follows commands  without difficulty and answers questions appropriately.  Her cranial  nerves and coordination are grossly intact.  Her reflexes are overall  diminished in both upper and lower extremities.  There is no abnormal  tone noted.  No clonus noted.  No tremors are appreciated.  No obvious  wasting is appreciated.   Sensation is intact to light touch in upper and lower extremities.   Motor strength in the upper and lower extremities is in the 5/5 range  without focal deficits.  She exhibits relatively good hip extensor  strength by standing from a sitting position without pushing off with  her hands.   She has limitations in cervical range of motion in all planes.  Her  shoulders are bilaterally limited to not more than 45 degrees of  abduction.   Her gait is stable with a short stride length.  Tandem gait is  attempted.  She has minimal trouble with this.  Romberg test is  performed adequately, and her balance is quite good.  She has crepitus  in bilateral shoulders and elbows and right knee.   IMPRESSION:  1. Chronic pain syndrome.  2. Severe bilateral shoulder osteoarthritis.  3. Bilateral  knee osteoarthritis, right knee worse than left, has a      total knee replacement on the left.  4. Cervical spondylosis with intermittent cervicalgia.  5. Lumbar spondylosis and chronic low back pain with mild symptoms of      spinal stenosis.   PLAN:  We will refill her Percocet 5/325 every 4-6 hours but not more  than 5 tablets per day.   [name] states that she is not too sleepy.  Her daughter confirms that  she is alert and active during the daytime.  There are no concerns with  oversedation or significant constipation at this time.  She takes her  medications as prescribed.  She has not  exhibited any aberrant behavior  with the use of her medications.  Her pill counts are appropriate.   We will refill her Percocet 5/325 every 4-6 hours not more than 5  tablets per day, #150, no refills.  We will also give her some samples  of Lidoderm #10 today.  She has been using this for a couple of years  and has reported overall improvement in her symptoms, but is unable to  afford the medication.  We will see her back in 1 month.           ______________________________  Brantley Stage, M.D.     DMK/MedQ  D:  05/21/2008 10:00:58  T:  05/21/2008 23:39:46  Job #:  [id number]   cc:   Evelena Peat, M.D.

## 2010-08-15 NOTE — Group Therapy Note (Signed)
MEDICAL RECORD NUMBER:  [id number]   DATE OF BIRTH:  08/31/1917   [name] is an 77 year old married white female who was referred by  Dr. Ollen Gross.   She is being seen in our pain and rehabilitative clinic for management of  predominantly bilateral shoulder pain. She also has complaints of  intermittent right knee pain and low back pain.   She comes to our clinic with her daughter who assists with giving some  history about her mother. Her daughter is also requesting that [name] be  treated with Dilaudid.   [name] has a long history of bilateral shoulder pain and knee pain. She  has been followed by Dr. Lequita Halt for several months, as well as Dr. Ethelene Hal.  She has undergone multiple joint injections into bilateral glenohumeral  joints as well as her knees. She has end-stage osteoarthritis in the  shoulders and is status post a knee replacement on the left by Dr. Fannie Knee.   [name] states her pain averages about a 10 on a scale of 10. The worst  time of day for her is morning. She has difficulty performing some of her  ADLs at that time, such as getting her shirt on and combing her hair.  Specifically, she complains bitterly about being unable to get her deodorant  on. Her pain, especially in the left shoulder, is described as constant. In  the left shoulder  injection she has not been successful in diminishing her pain.   Functional status is overall quite good. She can walk at least 10 minutes at  a time, she climbs stairs, and she drives. She is able to attend church  intermittently. She is independent with feeding, dressing, bathing,  toileting. She is able to make meals and does some light housekeeping duties  as well.   REVIEW OF SYSTEMS:  Positive for some difficulty ambulating, otherwise  negative.   Her daughter expresses concern about her having some falls in the house,  although she has not had a huge problem with this at this point.   PAST  MEDICAL HISTORY:  Is positive for history of some thyroid disease,  although apparently she has been recently taken off Synthroid. She has a  history of hypertension and hyperlipidemia.   PAST SURGICAL HISTORY:  Remarkable for breast lumpectomy many years ago,  benign, and left total knee replacement, Dr. Fannie Knee.   SOCIAL HISTORY:  The patient has been married over 40 years, lives with her  husband. She has one daughter. Denies alcohol or smoking.   EXAMINATION TODAY:  Blood pressure is 153/80, pulse 84, respirations 16, 98%  saturated on room air. She is an elderly female, mildly obese, who appears  her stated age. She is oriented x3. Her affect is bright, alert. She is  cooperative and pleasant, is able to give historical details of her history  without much difficulty.   She is able to stand up independently on her own. She does bring a cane to  the clinic but is able to ambulate in the room without any assistive device.  Her gait displays a normal base of support. Heel-toe mechanics are slightly  diminished but for the most part within normal limits. She has some  difficulty performing Romberg's test and appears a bit more unsteady when I  have her narrow her base of support. She has difficulty with tandem gait as  well.   Her cervical range of motion is diminished in all planes.  However,  significantly, she has only approximately 15-20 degrees of rotation to the  left whereas to the right, approximately 35 or so. She reports pain with  rotation to the left.   Shoulder range of motion is significantly limited as well today. Forward  flexion is approximately 90 degrees, abduction is also limited to 90.  Seated, her reflexes are 1+ at biceps, triceps, brachioradialis; 2+ at the  patellar tendon; 0 at the Achilles tendon. She has good strength throughout  both lower extremities in the 5/5 range with manual muscle testing. No  obvious focal weaknesses noted. No abnormal tone is  noted.   Upper extremity strength is somewhat more difficult to asses on the left due  to increased pain with exertion on the left. The right upper extremity is in  the 4+/5 range with isometric testing of shoulder abductors, biceps,  triceps, brachioradialis, and finger flexors. On the left significant pain  inhibition makes it difficult to assess full strength in the upper  extremities, generally in the 4/5 range however.   Decreased sensation is noted in the left lateral deltoid area with pinprick,  intact on the right, diminished on the left, and intact to the rest of the  left upper extremity, just a small patch over the deltoid area is  significantly diminished compared to the opposite side.   MRI dated September 18, 2000, lumbar without contrast:  Mild multifactorial  spinal stenosis, mild at L1-L2, moderate at L2-L3 and L3-L4, moderate to  severe at L4-L5, pars defect L5-S1, grade 1 spondylolisthesis, diffuse disc  degeneration L1-L2.   IMPRESSION:  1.  Bilateral glenohumeral joint arthritis.  2.  Bilateral knee osteoarthritis.  3.  Diminished cervical range of motion, especially rotation to the left,      with sensory deficit over the left deltoid, constant pain in the left      shoulder, possibly consistent with neuropathic pain component to this      area.  4.  Mild balance deficits.  5.  Lumbago, history of lumbar stenosis, degenerative disc disease.   PLAN:  Approximately a half an hour was spent talking to [name] and her  daughter. We reviewed multiple medications that have been used in the past.  Apparently, [name]' daughter has brought up the use of Dilaudid because  she had a husband who was using it during treatment for cancer. [name]  currently has been taking 5/500 Vicodin two three times a day for a total of  30 mg per day of hydrocodone. She tells me this is not treating her pain adequately. She is really not on any other medications for her pain at  this  time. She has been trialed on NSAIDs in the past as well as she has had a  trial of tramadol. However, she states that she may have taken the tramadol  with the hydrocodone and it might have made her somewhat confused and she is  a little reticent to take the tramadol again.   At this point, would like to try adjuvants with her prior to switching her  from the hydrocodone. Will set her up to see a physical therapies for a TENS  trial. Will also have nursing discuss the use of Lidoderm with her and will  give her a prescription for that today as well, one to three patches 12  hours on/12 hours off, #90. She currently does use some Biofreeze with some  improvement of her pain. We will add ibuprofen  just in the morning, 600 mg  one p.o. q.a.m., #30 written for today. Will increase her Norco slightly and  will decrease the amount of acetaminophen in the preparation from 500 to  325, allow her to take up to four 10/325 pills per day, dispense #100.   Would like to obtain cervical radiographs which will review at next visit.  Concern here for some stenosis giving her a combination of arthritic pain as  well as neuropathic pain to this left shoulder, which is possibly why it has  not been responding to previous shoulder injections. In that respect, may  consider adding some Neurontin at the next visit as a trial.   Will see her back in a month, review x-rays, assess the therapy program.  Will also get her set up in a physical therapy program to address balance  issues with her and assess for appropriate assistive device. For community  distances, she may well benefit from a Rollator walker.           ______________________________  Brantley Stage, M.D.     DMK/MedQ  D:  05/27/2005 10:59:58  T:  05/27/2005 12:10:06  Job #:  [id number]   cc:   Ollen Gross, M.D.  Fax: [phone]

## 2010-08-15 NOTE — Op Note (Signed)
NAME:  [name], [name]                           ACCOUNT NO.:  [id number]   MEDICAL RECORD NO.:  [id number]                   PATIENT TYPE:  AMB   LOCATION:  ENDO                                 FACILITY:  [facility]   PHYSICIAN:  Jordan Hawks. Elnoria Howard, MD                 DATE OF BIRTH:  03/13/1918   DATE OF PROCEDURE:  12/10/2003  DATE OF DISCHARGE:                                 OPERATIVE REPORT   PROCEDURE:  Colonoscopy.   ENDOSCOPIST:  Jordan Hawks. Elnoria Howard, M.D.   EQUIPMENT USED:  Olympus adult colonoscope.   INDICATIONS FOR PROCEDURE:  Left lower quadrant abdominal pain and screening  colonoscopy.   PHYSICAL EXAMINATION:  CARDIAC:  Regular rate and rhythm.  LUNGS:  Clear to auscultation bilaterally.  ABDOMEN:  Obese, soft, nontender and nondistended.   MEDICATIONS:  Versed 4 mg IV and Demerol 40 mg IV.   CONSENT:  Informed consent was obtained from the patient describing the risk  as bleeding, infection, perforation, medication reaction, a 10% risk of  missing a small colon cancer and polyp and the risk of death; all of which  are not exclusive of any other complications that can occur.   DESCRIPTION OF PROCEDURE:  After the patient was adequately sedated while in  the left lateral decubitus position, the colonoscope was inserted through  the anus and advanced under direct visualization to the cecum without  difficulty.  The prep was noted to be very good and upon withdrawal, the  endoscope from the cecum which was documented with photos, a small lipoma  was noted in the ascending colon.  Pan diverticulosis with the most severe  region in the sigmoid colon.  There is no evidence of inflammation,  erosions, ulcerations, masses or polyps.  Upon withdrawal of the scope into  the rectum and retroflexion there was evidence of large external  hemorrhoids.  No vascular abnormalities was seen throughout the entire  colon.  No complications were encountered.  The patient tolerated the  procedure  well.   RECOMMENDATIONS:  1.  Maintain a high gram fiber diet.  2.  No repeat colonoscopy for screening purposes given her age and adequacy      of prep during this procedure.  3.  Follow-up in the clinic in two weeks' time.                                               Jordan Hawks Elnoria Howard, MD    PDH/MEDQ  D:  12/11/2003  T:  12/11/2003  Job:  [id number]   cc:   Marjory Lies, M.D.  [address]  [place]  [place] [address]  Fax: [phone]

## 2010-09-09 ENCOUNTER — Ambulatory Visit: Payer: Medicare Other

## 2010-09-09 ENCOUNTER — Encounter: Payer: Medicare Other | Attending: Neurosurgery | Admitting: Neurosurgery

## 2010-09-09 DIAGNOSIS — M545 Low back pain, unspecified: Secondary | ICD-10-CM | POA: Insufficient documentation

## 2010-09-09 DIAGNOSIS — M47817 Spondylosis without myelopathy or radiculopathy, lumbosacral region: Secondary | ICD-10-CM

## 2010-09-09 DIAGNOSIS — M47812 Spondylosis without myelopathy or radiculopathy, cervical region: Secondary | ICD-10-CM

## 2010-09-09 DIAGNOSIS — M1710 Unilateral primary osteoarthritis, unspecified knee: Secondary | ICD-10-CM

## 2010-09-09 DIAGNOSIS — M19029 Primary osteoarthritis, unspecified elbow: Secondary | ICD-10-CM

## 2010-09-09 DIAGNOSIS — M48061 Spinal stenosis, lumbar region without neurogenic claudication: Secondary | ICD-10-CM | POA: Insufficient documentation

## 2010-09-09 DIAGNOSIS — M19019 Primary osteoarthritis, unspecified shoulder: Secondary | ICD-10-CM | POA: Insufficient documentation

## 2010-09-10 NOTE — Assessment & Plan Note (Signed)
[name] is a patient Dr. Leretha Dykes.  A 77 year old, lives alone that is seen today with [name] daughter.  [name] is followed here in the clinic for knee osteoarthritis, shoulder osteoarthritis, and low back pain.  [name] also has some cervical spondylosis with diminished range of motion.  [name] reports [name] overall pain to be about the same.  [name] rates it about an 8.  It is harp, burning anywhere from bad to dull to stabbing and aching.  [name] activity level is about 10.  Pain is same 24 hours a day. Sleep patterns are fair.  All activities aggravate.  Rest, heat, and medication tend to help.  [name] walks with a cane or walker and assistance.  [name] does not climb steps or drive.  [name] can only walk about 5 minutes.  [name] does use a wheelchair from time to time.  REVIEW OF SYSTEMS:  Notable for those difficulties, otherwise within normal limits.  PAST MEDICAL HISTORY:  Unchanged.  SOCIAL HISTORY:  Unchanged.  FAMILY HISTORY:  Unchanged.  PHYSICAL EXAMINATION:  VITAL SIGNS:  Blood pressure is 130/86, pulse 65, respirations 18, O2 sats 96 on room air. NEUROLOGIC:  [name] is alert and oriented x3.  [name] speech is clear.  [name] is constitutionally within normal limits.  [name] does walk little bit unstably with a cane.  [name] does have some diminished range of motion and a kyphotic-type gait, but overall [name] reports no worsening of [name] pain if anything it is just ongoing problem that is unchanged.  IMPRESSION: 1. Cervical spondylosis, diminished range of motion. 2. Shoulder arthritis bilaterally. 3. Bilateral knee osteoarthritis. 4. Lumbar spondylosis and stenosis.  PLAN:  We will continue [name] single pain pill which is Percocet 5/325 one p.o. q.4-6 h., not greater than 5 a day, #150 with no refill.  [name] questions are encouraged and answered.  [name] will follow up with Dr. Pamelia Hoit as scheduled in 1 month.     Emmanuel Gruenhagen L. Blima Dessert Electronically Signed    RLW/MedQ D:  09/09/2010 11:33:19  T:   09/10/2010 02:47:56  Job #:  [id number]

## 2010-10-08 ENCOUNTER — Ambulatory Visit: Payer: Medicare Other | Admitting: Physical Medicine and Rehabilitation

## 2010-10-08 ENCOUNTER — Encounter
Payer: Medicare Other | Attending: Physical Medicine and Rehabilitation | Admitting: Physical Medicine and Rehabilitation

## 2010-10-08 DIAGNOSIS — Z7901 Long term (current) use of anticoagulants: Secondary | ICD-10-CM | POA: Insufficient documentation

## 2010-10-08 DIAGNOSIS — M47817 Spondylosis without myelopathy or radiculopathy, lumbosacral region: Secondary | ICD-10-CM | POA: Insufficient documentation

## 2010-10-08 DIAGNOSIS — M47812 Spondylosis without myelopathy or radiculopathy, cervical region: Secondary | ICD-10-CM | POA: Insufficient documentation

## 2010-10-08 DIAGNOSIS — M19029 Primary osteoarthritis, unspecified elbow: Secondary | ICD-10-CM

## 2010-10-08 DIAGNOSIS — G8929 Other chronic pain: Secondary | ICD-10-CM | POA: Insufficient documentation

## 2010-10-08 DIAGNOSIS — M545 Low back pain, unspecified: Secondary | ICD-10-CM

## 2010-10-08 DIAGNOSIS — M159 Polyosteoarthritis, unspecified: Secondary | ICD-10-CM | POA: Insufficient documentation

## 2010-10-08 DIAGNOSIS — M25569 Pain in unspecified knee: Secondary | ICD-10-CM

## 2010-10-08 DIAGNOSIS — Z96659 Presence of unspecified artificial knee joint: Secondary | ICD-10-CM | POA: Insufficient documentation

## 2010-10-08 DIAGNOSIS — Z79899 Other long term (current) drug therapy: Secondary | ICD-10-CM | POA: Insufficient documentation

## 2010-10-09 NOTE — Assessment & Plan Note (Signed)
HISTORY OF PRESENT ILLNESS:  [name] is a pleasant 77 year old woman who was last seen by me on Aug 08, 2010.  Last month she had a nurse practitioner visit for refill of her pain medications.  [facility] for chronic pain complaints related to cervical spondylosis, severe shoulder osteoarthritis, bilateral knee osteoarthritis, status post left knee replacement, and lumbar spinal stenosis/claudication.  [name] relates a history of approximately 3 days ago waking up with a painful swollen right knee.  She denies any trauma.  She went to bed and it was okay, woke up and it was swollen and hurting her.  She tells me she has called over to [facility] and has attempted to follow up with Dr. Lequita Halt, this is in process currently.  She is in today for refill of her Percocet.  Average pain is between 7-9 on a scale of 10.  Her neck and shoulders ache as well as bilateral lower extremities.  There is no history of trauma.  Pain is typically worse with activity, improves with rest, medication, and  TENS unit.  She reports good relief with current meds.  FUNCTIONAL STATUS:  She has been having more trouble walking since the right knee swelled up on her a couple of days ago.  She reports overall it is improving.  She still not able to walk much more than 5 minutes at a time which is close to which she typically can walk.  No change in functional status as she continues to live independently. No new problems with respect to bowel or bladder, numbness, tingling, or weakness.  Denies depression, anxiety or suicidal ideation.  REVIEW OF SYSTEMS:  Otherwise negative.  Past medical, social, and family history are unchanged.  She continues to maintain contact with Dr. Doristine Counter.  She has her Coumadin checked there, in fact she has an appointment today for that.  She sees Dr. Donnie Aho for her cardiology issues.  PAIN  MEDICATIONS PRESCRIB[facility]OUGH [facility]:  Percocet 5/325 one p.o. q.4-6 h., not more than 5 per day, #150 per month.  PHYSICAL EXAMINATION:  VITAL SIGNS:  Blood pressure is 132/78, pulse 72, respirations 16, and 97% saturation on room air. GENERAL:  She is well-developed elderly woman who does not appear in any distress.  She is oriented x3. NEUROLOGIC :  Speech is clear.  Affect is bright.  She is alert, cooperative, and pleasant.  Follows commands without difficulty and she answers my questions appropriately.  Cranial nerves coordination are intact.  The reflexes are diminished in upper and lower extremities.  No abnormal tone, clonus, or tremors are noted.  Motor strength is generally good in both upper and lower extremities without obvious focal deficit.  She transitions from sitting to standing slowly.  She has an antalgic gait. EXTREMITIES:  Her right knee is evaluated.  It has a slight effusion appreciable today.  She has some tenderness, especially along the medial joint line.  IMPRESSION: 1. Right knee pain rather sudden onset occurring overnight.  No     history of trauma or injury in a patient who has a history of gout.     The patient states she is following up with Dr. Lequita Halt and has     already made an appointment and is waiting for [place] to fax some     records over to his office.  Also, I made a phone call over to Dr.  Burnett's office regarding her history of gout, possible treatment     for that as well.  She is going to be stopping there on the way     back for my office today at Dr. Mellody Life office. 2. Severe shoulder osteoarthritis, stable. 3. Lumbar spondylosis/spinal stenosis, neurogenic claudication is also     stable.  PLAN:  I refilled her Percocet as indicated above.  We will have a copy of our records sent over to Dr. Deri Fuelling office per patient's request.     Brantley Stage, M.D. Electronically Signed    DMK/MedQ D:  10/08/2010  14:44:04  T:  10/09/2010 01:38:34  Job #:  [id number]

## 2010-10-29 ENCOUNTER — Encounter
Payer: Medicare Other | Attending: Physical Medicine and Rehabilitation | Admitting: Physical Medicine and Rehabilitation

## 2010-10-29 DIAGNOSIS — M545 Low back pain, unspecified: Secondary | ICD-10-CM

## 2010-10-29 DIAGNOSIS — M19029 Primary osteoarthritis, unspecified elbow: Secondary | ICD-10-CM

## 2010-10-29 DIAGNOSIS — M19019 Primary osteoarthritis, unspecified shoulder: Secondary | ICD-10-CM | POA: Insufficient documentation

## 2010-10-29 DIAGNOSIS — M47817 Spondylosis without myelopathy or radiculopathy, lumbosacral region: Secondary | ICD-10-CM | POA: Insufficient documentation

## 2010-10-29 DIAGNOSIS — M1710 Unilateral primary osteoarthritis, unspecified knee: Secondary | ICD-10-CM

## 2010-10-29 DIAGNOSIS — G894 Chronic pain syndrome: Secondary | ICD-10-CM

## 2010-10-29 NOTE — Assessment & Plan Note (Signed)
HISTORY OF PRESENT ILLNESS:  [name] is a very pleasant 77 year old woman who is accompanied by her [name] to [facility].  She was last seen by me on October 08, 2010.  She is back in today for refill of her Percocet.  [name] has a history of severe shoulder osteoarthritis bilaterally as well as right knee osteoarthritis which had a flare up with gout a couple of weeks ago.  She saw Dr. Lequita Halt who apparently suggested to her per her report that she continue with pain management.  She also has a known lumbar spondylosis/spinal stenosis and may have some neurogenic claudication as well.  She is requesting a refill of her Percocet and also Lidoderm patches today.  She continues to maintain contact with Dr. Doristine Counter, her primary care physician.  Average pain is about 8 on a scale of 10.  Sleep is fair.  Pain is constant throughout the day, worse with activities, improves with rest, heat, medication, and TENS unit.  She reports fair relief with current medications.  FUNCTIONAL STATUS:  She continues to live independently.  She walks within the home.  She does use a cane.  She has difficulty with stairs. She does not drive.  She is independent with self-care as well as meal prep and some household duties.  REVIEW OF SYSTEMS:  Negative today.  Past medical, social, and family history are otherwise noncontributory. She was treated for her gout through Dr. Mellody Life office a few weeks ago and her severe right knee pain has resolved.  PHYSICAL EXAMINATION:  VITAL SIGNS:  Today her blood pressure is 106/62, pulse is 64, respirations 14, and 96% saturated on room air. NEUROLOGIC:  She is a well-developed, mildly obese 77 year old who appears her stated age.  Does not appear in any distress.  She is oriented x3.  Speech is clear.  Affect is bright.  She is alert, cooperative, and pleasant.  Follows commands without difficulty. Answers  my questions appropriately.  Cranial nerves to coordination are intact.  Her reflexes are diminished in both upper and lower extremities without abnormal tone, clonus, or tremors.  No new sensory deficits are appreciated.  Her motor strength is quite good in both upper and lower extremities within her range of motion.  She transitions from sitting to standing slowly.  Her gait is careful and slow.  She does use a cane. She has some compromise with tandem gait.  Romberg test is performed adequately.  Right knee is without warmth.  No effusion is apparent at this point. She has tenderness along the joint lines.  IMPRESSION: 1. Right knee osteoarthritis with recent history of gout flare up,     treated per Dr. Mellody Life office.  Currently, she is on Colcrys 0.6     mg for this. 2. bilateral severe shoulder OA 3. Cervical spondylosis 4. Lumbar spondylosis/stenosis  PLAN:  We will refill her Lidoderm patches, 1 patch to affected area, 12 hours on and 12 hours off, 59-month supply and Percocet 5/325 q.4-6 h., not more than 5 pills per day.  [name] takes her medications as prescribed without evidence of aberrant behavior or continue to monitor her pill counts as well as random urine drug screens.  I have answered all her questions.  She is comfortable with our plan. We will have her follow up with the nurse practitioner in 1 month and I will see her back in 2 months.  They are comfortable with this  plan.     Brantley Stage, M.D. Electronically Signed    DMK/MedQ D:  10/29/2010 14:07:47  T:  10/29/2010 23:48:50  Job #:  [id number]

## 2010-12-03 ENCOUNTER — Encounter: Payer: Medicare Other | Attending: Neurosurgery | Admitting: Neurosurgery

## 2010-12-03 DIAGNOSIS — M25519 Pain in unspecified shoulder: Secondary | ICD-10-CM | POA: Insufficient documentation

## 2010-12-03 DIAGNOSIS — M47817 Spondylosis without myelopathy or radiculopathy, lumbosacral region: Secondary | ICD-10-CM | POA: Insufficient documentation

## 2010-12-03 DIAGNOSIS — M159 Polyosteoarthritis, unspecified: Secondary | ICD-10-CM | POA: Insufficient documentation

## 2010-12-03 DIAGNOSIS — G894 Chronic pain syndrome: Secondary | ICD-10-CM

## 2010-12-03 DIAGNOSIS — M1710 Unilateral primary osteoarthritis, unspecified knee: Secondary | ICD-10-CM

## 2010-12-03 DIAGNOSIS — G8929 Other chronic pain: Secondary | ICD-10-CM | POA: Insufficient documentation

## 2010-12-03 NOTE — Assessment & Plan Note (Signed)
ACCOUNT:  [id number].  This is a patient of Dr. Pamelia Hoit, was seen for chronic pain in her shoulders.  She has osteoarthritis bilaterally as well as knee osteoarthritis.  She is here for refill on Percocet.  The patient has some back pain as well, but she also states that she has been no change. She rates her pain at an 8, it is a sharp, burning to dull, stabbing, and aching-type pain.  General activity level is 9.  Pain is the same 24 hours a day.  Sleep patterns are fair.  All activities are aggravate her pain.  Rest, heat, medication tends to help mobility.  She is independent.  She is somewhat unstable.  She does not work.  REVIEW OF SYSTEMS:  Notable for the difficulties as described above, otherwise are within normal limits.  No aberrant behaviors.  Oswestry score is 68.  PAST MEDICAL HISTORY:  Unchanged.  SOCIAL HISTORY:  Unchanged.  FAMILY HISTORY:  Unchanged.  PHYSICAL EXAMINATION:  Blood pressure 140/59, pulse 86, respirations 18, O2 sats 98% on room air.  Motor strengths within normal limits for her age.  Her sensation is intact.  Constitutionally, she is thin.  She is alert and oriented x3.  Her gait again is somewhat unstable, but she is independent with ambulation.  ASSESSMENT: 1. Bilateral knee osteoarthritis. 2. Shoulder pain. 3. History of lumbar spondylosis.  PLAN:  Refill Percocet 5/325 one p.o. q.4-6 h. p.r.n., not greater than five a day, 150 with no refill.  Her questions were otherwise encouraged and answered.  We will see her back here in 1 month in Dr. Wynn Banker' clinic.     Sabas Frett L. Blima Dessert Electronically Signed    RLW/MedQ D:  12/03/2010 09:36:43  T:  12/03/2010 10:40:13  Job #:  [id number]

## 2011-01-07 ENCOUNTER — Encounter
Payer: Medicare Other | Attending: Physical Medicine and Rehabilitation | Admitting: Physical Medicine and Rehabilitation

## 2011-01-07 DIAGNOSIS — M542 Cervicalgia: Secondary | ICD-10-CM | POA: Insufficient documentation

## 2011-01-07 DIAGNOSIS — M545 Low back pain, unspecified: Secondary | ICD-10-CM | POA: Insufficient documentation

## 2011-01-07 DIAGNOSIS — Z8639 Personal history of other endocrine, nutritional and metabolic disease: Secondary | ICD-10-CM | POA: Insufficient documentation

## 2011-01-07 DIAGNOSIS — Z862 Personal history of diseases of the blood and blood-forming organs and certain disorders involving the immune mechanism: Secondary | ICD-10-CM | POA: Insufficient documentation

## 2011-01-07 DIAGNOSIS — M19019 Primary osteoarthritis, unspecified shoulder: Secondary | ICD-10-CM | POA: Insufficient documentation

## 2011-01-07 DIAGNOSIS — M47812 Spondylosis without myelopathy or radiculopathy, cervical region: Secondary | ICD-10-CM | POA: Insufficient documentation

## 2011-01-07 DIAGNOSIS — M1710 Unilateral primary osteoarthritis, unspecified knee: Secondary | ICD-10-CM

## 2011-01-07 DIAGNOSIS — M25519 Pain in unspecified shoulder: Secondary | ICD-10-CM | POA: Insufficient documentation

## 2011-01-07 DIAGNOSIS — Z96659 Presence of unspecified artificial knee joint: Secondary | ICD-10-CM | POA: Insufficient documentation

## 2011-01-07 DIAGNOSIS — G894 Chronic pain syndrome: Secondary | ICD-10-CM

## 2011-01-07 DIAGNOSIS — M25569 Pain in unspecified knee: Secondary | ICD-10-CM | POA: Insufficient documentation

## 2011-01-07 DIAGNOSIS — R609 Edema, unspecified: Secondary | ICD-10-CM

## 2011-01-07 NOTE — Assessment & Plan Note (Signed)
[name] is a very pleasant 77 year old woman who is accompanied by her daughter today to our [facility].  She is back in today for refill of her Percocet.  In the interim, she had her birthday yesterday and although she is on a salt-restricted diet, she wanted to eat country ham for her birthday and subsequently has had some new swelling in bilateral lower extremities. Her primary care physician is aware she had an extra dose of Lasix apparently.  With respect to her multiple pain complaints, her average pain is about 8 on a scale of 10.  She has problems with shoulder pain especially right knee pain, low back pain, and neck pain.  She finds that the Percocet works very well in allowing her to remain functional. She continues to live independently.  She can walk less than 5-10 minutes at a time.  She is independent with self-care.  She does not drive or climb stairs.  She typically will use a cane or walker for longer distances.  No new problems with bowel or bladder.  Denies depression, anxiety, or suicidal ideation.  No changes with respect to review of systems.  PAST MEDICAL HISTORY:  Unchanged.  SOCIAL HISTORY:  Unchanged.  FAMILY HISTORY:  Unchanged.  [facility]ications [facility] through [facility] include Percocet 5/325, not more than 5 tablets per day and p.r.n. Lidoderm patch.  PHYSICAL EXAMINATION:  Her blood pressure is 130/28, pulse 79, respirations 18, 98% saturation on room air.  She is a well-developed, well-nourished woman who does not appear in any distress.  She is oriented x3.  Speech is clear.  Affect is bright.  She is alert, cooperative, and pleasant.  Follows commands without difficulty. Answers questions appropriately.  Cranial nerves and coordination are grossly intact.  Reflexes are diminished in both upper and lower extremities.  No abnormal tone, clonus, or tremors are noted.  Her motor strength is good in both  upper and lower extremities.  No new sensory deficits are appreciated.  She transitions well from sitting to standing without using upper extremities.  Her gait is stable, although bilaterally she has a short stride length, very careful.  Tandem gait was not assessed today. Romberg test was not assessed today.  She has significant limitations in cervical range of motion.  She has crepitus and loss of range of motion in both shoulders, not able to abduct more than 45 degrees.  Lumbar motion is limited.  She has tenderness at both knees, left knee replacement is noted.  IMPRESSION: 1. Cervical spondylosis. 2. Severe shoulder osteoarthritis bilaterally. 3. Right knee osteoarthritis and history of left knee replacement. 4. Lumbar spondylosis/spinal stenosis/neurogenic claudication. 5. Intermittent gout flare-ups.  PLAN:  We will refill her pain medication today.  She has been taking her medications as prescribed.  No evidence of aberrant behavior has been observed.  Her last urine drug screen was collected on Aug 08, 2010, and was noted to be consistent.  I have answered all her questions.  She is comfortable with our plan.  I asked her to maintain contact with primary care for her other medical problems.  We will have her follow up with nurse practitioner next month for refill of pain medications.     Brantley Stage, M.D. Electronically Signed    DMK/MedQ D:  01/07/2011 11:24:32  T:  01/07/2011 14:45:24  Job #:  [id number]

## 2011-02-11 ENCOUNTER — Encounter: Payer: Medicare Other | Attending: Neurosurgery | Admitting: Neurosurgery

## 2011-02-11 ENCOUNTER — Ambulatory Visit: Payer: Medicare Other | Admitting: Neurosurgery

## 2011-02-11 DIAGNOSIS — M25519 Pain in unspecified shoulder: Secondary | ICD-10-CM | POA: Insufficient documentation

## 2011-02-11 DIAGNOSIS — M47812 Spondylosis without myelopathy or radiculopathy, cervical region: Secondary | ICD-10-CM | POA: Insufficient documentation

## 2011-02-11 DIAGNOSIS — M1710 Unilateral primary osteoarthritis, unspecified knee: Secondary | ICD-10-CM

## 2011-02-11 DIAGNOSIS — M25569 Pain in unspecified knee: Secondary | ICD-10-CM | POA: Insufficient documentation

## 2011-02-11 DIAGNOSIS — M48061 Spinal stenosis, lumbar region without neurogenic claudication: Secondary | ICD-10-CM | POA: Insufficient documentation

## 2011-02-11 DIAGNOSIS — S0083XA Contusion of other part of head, initial encounter: Secondary | ICD-10-CM | POA: Insufficient documentation

## 2011-02-11 DIAGNOSIS — M19019 Primary osteoarthritis, unspecified shoulder: Secondary | ICD-10-CM | POA: Insufficient documentation

## 2011-02-11 DIAGNOSIS — M47817 Spondylosis without myelopathy or radiculopathy, lumbosacral region: Secondary | ICD-10-CM | POA: Insufficient documentation

## 2011-02-11 DIAGNOSIS — M19029 Primary osteoarthritis, unspecified elbow: Secondary | ICD-10-CM

## 2011-02-11 DIAGNOSIS — W19XXXA Unspecified fall, initial encounter: Secondary | ICD-10-CM | POA: Insufficient documentation

## 2011-02-11 DIAGNOSIS — S0003XA Contusion of scalp, initial encounter: Secondary | ICD-10-CM | POA: Insufficient documentation

## 2011-02-11 NOTE — Assessment & Plan Note (Signed)
This is a patient of Dr. Pamelia Dunn who is seen for medicine refills. She has some problems with pain in her shoulder joints as well as her knees.  The patient has taken a fall in her backyard, has an obvious resolving bruise and hematoma to her left cheek but still very functional.  She rates her pain as 8 or 9.  It is a sharp, burning, dull, stabbing, and aching pain.  General activity level is 10.  Pain is same 24 hours a day.  Sleep patterns are fair. Walking, bending, sitting and standing tend to aggravate.  Rest, heat, medication with TENS unit helps.  She uses a cane or walker for assistance.  She can walk about 5 minutes at a time.  Functionally, she needs help with ADLs and household duties.  REVIEW OF SYSTEMS:  Notable for the difficulties described above, otherwise, within normal limits.  PAST MEDICAL HISTORY:  Unchanged.  SOCIAL HISTORY:  Unchanged.  FAMILY HISTORY:  Unchanged.  PHYSICAL EXAMINATION:  VITAL SIGNS:  Blood pressure 114/82, pulse 76, respirations 14, O2 sat is not recorded. CONSTITUTIONAL:  She is thin, she is alert and oriented x3.  She has a slightly unstable gait, but walking independently with a single-point cane.  Motor strength and sensation are intact for her age and build.  IMPRESSION: 1. History of cervical spondylosis. 2. History of a recent fall with resolving facial bruise. 3. Severe osteoarthritis of the shoulders bilaterally. 4. Right knee osteoarthritis. 5. Lumbar spondylosis and spinal stenosis.  PLAN:  Refill of Percocet 5/325 1 p.o. q.4-6 h. p.r.n. 150 not more than 5 a day.  Questions were encouraged and answered.  We will see her in a month.     [name] L. [name] Electronically Signed    RLW/MedQ D:  02/11/2011 08:55:30  T:  02/11/2011 09:22:26  Job #:  [id number]

## 2011-03-18 ENCOUNTER — Encounter: Payer: Medicare Other | Attending: Neurosurgery | Admitting: Neurosurgery

## 2011-03-18 DIAGNOSIS — M79609 Pain in unspecified limb: Secondary | ICD-10-CM | POA: Insufficient documentation

## 2011-03-18 DIAGNOSIS — M545 Low back pain, unspecified: Secondary | ICD-10-CM | POA: Insufficient documentation

## 2011-03-18 DIAGNOSIS — M159 Polyosteoarthritis, unspecified: Secondary | ICD-10-CM | POA: Insufficient documentation

## 2011-03-18 DIAGNOSIS — M47812 Spondylosis without myelopathy or radiculopathy, cervical region: Secondary | ICD-10-CM

## 2011-03-18 DIAGNOSIS — M1710 Unilateral primary osteoarthritis, unspecified knee: Secondary | ICD-10-CM

## 2011-03-18 DIAGNOSIS — G894 Chronic pain syndrome: Secondary | ICD-10-CM

## 2011-03-18 DIAGNOSIS — G8929 Other chronic pain: Secondary | ICD-10-CM | POA: Insufficient documentation

## 2011-03-18 DIAGNOSIS — M25519 Pain in unspecified shoulder: Secondary | ICD-10-CM | POA: Insufficient documentation

## 2011-03-18 DIAGNOSIS — M47817 Spondylosis without myelopathy or radiculopathy, lumbosacral region: Secondary | ICD-10-CM | POA: Insufficient documentation

## 2011-03-19 NOTE — Assessment & Plan Note (Signed)
This is a patient of Dr. Pamelia Hoit seen for medicine refills with chronic shoulder and leg and back pain.  They report no change in her pain at an 8.  It is a sharp, burning, dull, stabbing, tingling and aching pain.  General activity level is 10.  Pain is same 24 hours a day.  Sleep patterns are fair.  All activities are aggravate.  Rest, heat, medication tend to help.  She walks with a cane or walker.  She does not drive.  Functionally, she is independent.  REVIEW OF SYSTEMS:  Notable for the difficulties described above, otherwise unremarkable.  No aberrant behaviors.  Last pill count and UDS consistent.  Past medical history, social history, family history are unchanged.  PHYSICAL EXAMINATION:  Her blood pressure is 110/60, pulse 73, respirations 20, O2 sats 97 on room air.  Motor strength and sensation are intact, although she is weak due to her age more than anything. Constitutionally, she is thin.  She is alert and oriented x3.  She has a slight shuffled to her gait.  IMPRESSION: 1. History of cervical spondylosis. 2. Severe osteoarthritis of the shoulders. 3. Right knee osteoarthritis. 4. Lumbar spondylosis and spinal stenosis.  PLAN:  Refill Percocet 5/325 1 p.o. q.4-6 hours p.r.n., 150 with no refill, not more than 5 a day.  The questions were encouraged and answered.  I will see her back in a month.     Burdett Pinzon L. Blima Dessert Electronically Signed    RLW/MedQ D:  03/18/2011 09:24:58  T:  03/19/2011 04:25:54  Job #:  [id number]

## 2011-04-20 DIAGNOSIS — M1711 Unilateral primary osteoarthritis, right knee: Secondary | ICD-10-CM

## 2011-04-20 DIAGNOSIS — M109 Gout, unspecified: Secondary | ICD-10-CM | POA: Insufficient documentation

## 2011-04-20 DIAGNOSIS — M47812 Spondylosis without myelopathy or radiculopathy, cervical region: Secondary | ICD-10-CM | POA: Insufficient documentation

## 2011-04-20 DIAGNOSIS — M1990 Unspecified osteoarthritis, unspecified site: Secondary | ICD-10-CM | POA: Insufficient documentation

## 2011-04-20 DIAGNOSIS — M47816 Spondylosis without myelopathy or radiculopathy, lumbar region: Secondary | ICD-10-CM

## 2011-04-20 DIAGNOSIS — M19019 Primary osteoarthritis, unspecified shoulder: Secondary | ICD-10-CM

## 2011-04-22 ENCOUNTER — Encounter
Payer: Medicare Other | Attending: Physical Medicine and Rehabilitation | Admitting: Physical Medicine and Rehabilitation

## 2011-04-22 DIAGNOSIS — Z862 Personal history of diseases of the blood and blood-forming organs and certain disorders involving the immune mechanism: Secondary | ICD-10-CM | POA: Insufficient documentation

## 2011-04-22 DIAGNOSIS — M47817 Spondylosis without myelopathy or radiculopathy, lumbosacral region: Secondary | ICD-10-CM | POA: Insufficient documentation

## 2011-04-22 DIAGNOSIS — M1710 Unilateral primary osteoarthritis, unspecified knee: Secondary | ICD-10-CM | POA: Insufficient documentation

## 2011-04-22 DIAGNOSIS — G894 Chronic pain syndrome: Secondary | ICD-10-CM

## 2011-04-22 DIAGNOSIS — I739 Peripheral vascular disease, unspecified: Secondary | ICD-10-CM | POA: Insufficient documentation

## 2011-04-22 DIAGNOSIS — M19019 Primary osteoarthritis, unspecified shoulder: Secondary | ICD-10-CM | POA: Insufficient documentation

## 2011-04-22 DIAGNOSIS — G8929 Other chronic pain: Secondary | ICD-10-CM | POA: Insufficient documentation

## 2011-04-22 DIAGNOSIS — Z96659 Presence of unspecified artificial knee joint: Secondary | ICD-10-CM | POA: Insufficient documentation

## 2011-04-22 DIAGNOSIS — M545 Low back pain, unspecified: Secondary | ICD-10-CM

## 2011-04-22 DIAGNOSIS — M47812 Spondylosis without myelopathy or radiculopathy, cervical region: Secondary | ICD-10-CM | POA: Insufficient documentation

## 2011-04-22 DIAGNOSIS — Z8639 Personal history of other endocrine, nutritional and metabolic disease: Secondary | ICD-10-CM | POA: Insufficient documentation

## 2011-04-22 DIAGNOSIS — M542 Cervicalgia: Secondary | ICD-10-CM | POA: Insufficient documentation

## 2011-04-22 DIAGNOSIS — M48061 Spinal stenosis, lumbar region without neurogenic claudication: Secondary | ICD-10-CM | POA: Insufficient documentation

## 2011-04-22 NOTE — Assessment & Plan Note (Signed)
[name] is a pleasant 78 year old woman, who is accompanied by her daughter to our [facility].  She is back in today for refill of her Percocet.  She has chronic pain complaints involving her neck.  She has severe shoulder osteoarthritis bilaterally, right knee osteoarthritis with a history of left knee replacement.  She also has lumbar spondylosis/spinal stenosis, neurogenic claudication, and intermittent gout flare-ups.  She reports no new problems.  Her pain level averages about an 8 on a scale of 10.  Pain is described as sharp, burning, stabbing, dull, tingling, and aching in nature.  Interfering significantly with activity levels, she does use a cane for ambulation.  She has not had any falls since I had seen her last in October.  She reports good relief with current medications.  She continues to live independently.  Her daughter brings most of her meals for her.  REVIEW OF SYSTEMS:  Noncontributory today.  No new problems.  PAST MEDICAL HISTORY:  Unchanged.  SOCIAL HISTORY:  Unchanged.  FAMILY HISTORY:  Unchanged.  She does have a visit with her cardiologist, Dr. Donnie Aho today and she is in a bit of a hurry to get to his appointment this morning as well.  PHYSICAL EXAMINATION:  VITAL SIGNS:  Her blood pressure is 156/82, pulse 76, respirations 18, 98% saturated on room air. GENERAL:  She is a well-developed, well-nourished, elderly female who does not appear in any distress.  She is oriented x3. NEUROLOGIC:  Speech is clear.  Affect is bright.  She is alert, cooperative, and pleasant.  Follows commands without difficulty. Answers my questions appropriately.  Cranial nerves and coordination are grossly intact.  Her reflexes are diminished in the lower extremities. No abnormal tone, clonus, or tremors are noted.  Her motor strength is relatively good in both lower extremities at hip flexors, knee extensors, dorsiflexors, plantar  flexors.  She has some hip extensor weakness which is demonstrated as she attempts to exit her chair.  She does use her upper extremities to push out.  No increased pain with internal or external rotation at the hips.  Her right knee has some joint line tenderness without effusion.  No lower extremity edema is appreciated today.  She has crepitus in both shoulders and limited abduction at both shoulders.  IMPRESSION: 1. Cervical spondylosis, stable. 2. Severe shoulder osteoarthritis bilaterally, stable. 3. Right knee osteoarthritis and history of left knee replacement,     stable. 4. Lumbar spondylosis/spinal stenosis/neurogenic claudication. 5. Intermittent gout flare-ups which are managed by her primary care     physician.  PLAN:  I will refill her Percocet today 5/325, not more than 5 tablets per day.  Her last urine drug screen was in May 2012, and was consistent.  She takes her medications as prescribed.  No evidence of aberrant behavior has been observed.  She does not have any problems with constipation or sedation from these medications.  I will see her back in a month.  We will continue to monitor her Percocet use.     Brantley Stage, M.D.    DMK/MedQ D:  04/22/2011 40:98:11  T:  04/22/2011 22:33:31  Job #:  [id number]

## 2011-05-25 ENCOUNTER — Encounter: Payer: Medicare Other | Attending: Neurosurgery | Admitting: Physical Medicine and Rehabilitation

## 2011-05-25 ENCOUNTER — Encounter: Payer: Self-pay | Admitting: Physical Medicine and Rehabilitation

## 2011-05-25 VITALS — BP 110/68 | HR 72 | Resp 14 | Ht 59.0 in | Wt 115.0 lb

## 2011-05-25 DIAGNOSIS — M159 Polyosteoarthritis, unspecified: Secondary | ICD-10-CM | POA: Insufficient documentation

## 2011-05-25 DIAGNOSIS — M545 Low back pain, unspecified: Secondary | ICD-10-CM | POA: Insufficient documentation

## 2011-05-25 DIAGNOSIS — G8929 Other chronic pain: Secondary | ICD-10-CM | POA: Insufficient documentation

## 2011-05-25 DIAGNOSIS — M1710 Unilateral primary osteoarthritis, unspecified knee: Secondary | ICD-10-CM | POA: Insufficient documentation

## 2011-05-25 DIAGNOSIS — M25519 Pain in unspecified shoulder: Secondary | ICD-10-CM | POA: Insufficient documentation

## 2011-05-25 DIAGNOSIS — M542 Cervicalgia: Secondary | ICD-10-CM

## 2011-05-25 DIAGNOSIS — M19029 Primary osteoarthritis, unspecified elbow: Secondary | ICD-10-CM

## 2011-05-25 DIAGNOSIS — M47812 Spondylosis without myelopathy or radiculopathy, cervical region: Secondary | ICD-10-CM | POA: Insufficient documentation

## 2011-05-25 DIAGNOSIS — M47817 Spondylosis without myelopathy or radiculopathy, lumbosacral region: Secondary | ICD-10-CM | POA: Insufficient documentation

## 2011-05-25 DIAGNOSIS — M79609 Pain in unspecified limb: Secondary | ICD-10-CM | POA: Insufficient documentation

## 2011-05-25 DIAGNOSIS — M48061 Spinal stenosis, lumbar region without neurogenic claudication: Secondary | ICD-10-CM

## 2011-05-25 MED ORDER — OXYCODONE-ACETAMINOPHEN 5-325 MG PO TABS: 1.0000 | ORAL_TABLET | ORAL | Status: DC | PRN

## 2011-05-25 NOTE — Progress Notes (Deleted)
  Subjective:    Patient ID: [name], female    DOB: 11-08-1917, 78 y.o.   MRN: [id number]  HPI  Pain Inventory Average Pain 9 Pain Right Now 9 My pain is sharp, burning, dull, stabbing, tingling and aching  In the last 24 hours, has pain interfered with the following? General activity 9 Relation with others 9  What TIME of day is your pain at its worst? morning Sleep (in general) Fair  Pain is worse with: walking, bending, sitting, inactivity and standing Pain improves with: rest, heat/ice and medication Relief from Meds: 7  Mobility walk with assistance use a cane use a walker how many minutes can you walk? Very few ability to climb steps?  no do you drive?  no use a wheelchair  Function retired  Neuro/Psych trouble walking  Prior Studies Any changes since last visit?  no  Physicians involved in your care Primary care Dr. Marjory Lies      Review of Systems  Constitutional: Negative.   HENT: Negative.   Eyes: Negative.   Respiratory: Negative.   Cardiovascular: Negative.   Gastrointestinal: Negative.   Genitourinary: Negative.   Musculoskeletal: Negative.   Skin: Negative.   Neurological: Negative.   Hematological: Negative.  Negative for adenopathy.  Psychiatric/Behavioral: Negative.        Objective:   Physical Exam        Assessment & Plan:

## 2011-05-25 NOTE — Progress Notes (Signed)
  Subjective:    Patient ID: [name], female    DOB: May 09, 1917, 78 y.o.   MRN: [id number] The patient is a 78 year old woman who lives independently. She is seen here for chronic pain which is related to severe osteoarthritis of her neck, low back, shoulders, bilateral knees. She has a history of the left knee replacement. She has known lumbar spondyosis and spinal stenosis. She also has neurogenic claudication which limits her ambulation capacity.  Pain in these areas continues to be between 8/10-9/10.  No new reported falls continues to use cane for ambulation.  Continues to use medications and reports no history of over sedation or constipation.  Reports seeing primary care within the last couple of weeks for cold symptoms.  She is here for a refill of her pain medications.Review of Systems  Constitutional: Negative.   HENT: Negative.   Eyes: Negative.   Respiratory: Negative.   Cardiovascular: Negative.   Gastrointestinal: Negative.   Genitourinary: Negative.   Musculoskeletal: Negative.   Skin: Negative.   Neurological: Negative.   Hematological: Negative.  Negative for adenopathy.  Psychiatric/Behavioral: Negative.        Leg Pain   Back Pain Associated symptoms include leg pain.  Neck Pain  Associated symptoms include leg pain.      Review of Systems  HENT: Positive for neck pain.   Musculoskeletal: Positive for back pain.       Objective:   Physical Exam The patient is an elderly woman who does not appear in any distress. The patient is oriented x3. She's alert and cooperative. She follows commands without difficulty and answers questions appropriately.  Cranial nerves are grossly intact with the exception of hearing. Coordination is grossly intact. Reflexes are diminished in upper and lower extremities without abnormal tone clonus or tremors.  Motor strength is generally good in both upper and lower extremities without obvious focal  deficit.  Significant limitations in range of motion are noted in her neck, bilateral shoulders and low back.  She exits her chair slowly and carefully. Gait is mildly unstable without her cane. She complains of knee pain transitioning from sit to stand.       Assessment & Plan:  1. Cervical spondyosis-stable  2 bilateral severe shoulder osteoarthritis-stable  3 right knee osteoarthritis-stable  4 lumbar spondyosis/spinal stenosis-stable  5 neurogenic claudication-stable  6. Intermittent gout flare up  Plan:   The Opioid medication pill count was appropriate.  No reported significant side effects of Opioid medications noted.  No aberrant behavior noted.  Patient cautioned regarding operation of machinery or vehicles.  Patient understands risk and benefits of these medications.  There is no indication or report of risk to self or others.  Last UDS 05.11.2012 and was consistant.  Information regarding gout flare up will be given to patient today. Pain medication will be refilled. Her questions were encouraged and answered. I'll see her back in a month.

## 2011-05-25 NOTE — Patient Instructions (Addendum)
Gout Gout is caused by a buildup of uric acid crystals in the joints. The crystals make your joints sore. This is like having sand in your joints. Repeat attacks are common. Gout can be treated. HOME CARE   Do not take aspirin for pain.   Only take medicine as told by your doctor.   You may use cold treatments (ice) on painful joints.   Put ice in a plastic bag.   Place a towel between your skin and the bag.   Leave the ice on for 15 to 20 minutes at a time, 3 to 4 times a day.   Rest in bed as much as possible. When in bed, keep the sheets and blankets off your sore joints.   Keep the sore joints raised (elevated).   Use crutches if your legs or ankles hurt.   Drink enough water and fluids to keep your pee (urine) clear or pale yellow. This helps your body get rid of uric acid. Do not drink alcohol.   Follow diet instructions as told by your doctor.   Keep your body at a healthy weight.  GET HELP RIGHT AWAY IF:   You have a temperature by mouth above 102 F (38.9 C), not controlled by medicine.   You have watery poop (diarrhea).   You are throwing up (vomiting).   You do not feel better in 1 day, or you are getting worse.   Your joint hurts more.   You have the chills.  MAKE SURE YOU:   Understand these instructions.   Will watch your condition.   Will get help right away if you are not doing well or get worse.  Document Released: 12/24/2007 Document Revised: 11/26/2010 Document Reviewed: 06/24/2009 [employer] Patient Information 2012 [employer]. 

## 2011-06-01 ENCOUNTER — Ambulatory Visit: Payer: Medicare Other | Admitting: Physical Medicine and Rehabilitation

## 2011-06-22 ENCOUNTER — Encounter: Payer: Self-pay | Admitting: Physical Medicine and Rehabilitation

## 2011-06-22 ENCOUNTER — Encounter: Payer: Medicare Other | Attending: Neurosurgery | Admitting: Physical Medicine and Rehabilitation

## 2011-06-22 VITALS — BP 118/90 | HR 80 | Resp 18 | Ht <= 58 in | Wt 117.0 lb

## 2011-06-22 DIAGNOSIS — M19019 Primary osteoarthritis, unspecified shoulder: Secondary | ICD-10-CM

## 2011-06-22 DIAGNOSIS — M25519 Pain in unspecified shoulder: Secondary | ICD-10-CM | POA: Insufficient documentation

## 2011-06-22 DIAGNOSIS — M545 Low back pain, unspecified: Secondary | ICD-10-CM | POA: Insufficient documentation

## 2011-06-22 DIAGNOSIS — M1710 Unilateral primary osteoarthritis, unspecified knee: Secondary | ICD-10-CM

## 2011-06-22 DIAGNOSIS — M47816 Spondylosis without myelopathy or radiculopathy, lumbar region: Secondary | ICD-10-CM

## 2011-06-22 DIAGNOSIS — M79609 Pain in unspecified limb: Secondary | ICD-10-CM | POA: Insufficient documentation

## 2011-06-22 DIAGNOSIS — M129 Arthropathy, unspecified: Secondary | ICD-10-CM

## 2011-06-22 DIAGNOSIS — M47812 Spondylosis without myelopathy or radiculopathy, cervical region: Secondary | ICD-10-CM | POA: Insufficient documentation

## 2011-06-22 DIAGNOSIS — M47817 Spondylosis without myelopathy or radiculopathy, lumbosacral region: Secondary | ICD-10-CM | POA: Insufficient documentation

## 2011-06-22 DIAGNOSIS — M1711 Unilateral primary osteoarthritis, right knee: Secondary | ICD-10-CM

## 2011-06-22 DIAGNOSIS — M1990 Unspecified osteoarthritis, unspecified site: Secondary | ICD-10-CM

## 2011-06-22 DIAGNOSIS — G8929 Other chronic pain: Secondary | ICD-10-CM | POA: Insufficient documentation

## 2011-06-22 DIAGNOSIS — M159 Polyosteoarthritis, unspecified: Secondary | ICD-10-CM | POA: Insufficient documentation

## 2011-06-22 DIAGNOSIS — IMO0002 Reserved for concepts with insufficient information to code with codable children: Secondary | ICD-10-CM

## 2011-06-22 MED ORDER — OXYCODONE-ACETAMINOPHEN 5-325 MG PO TABS: 1.0000 | ORAL_TABLET | ORAL | Status: DC | PRN

## 2011-06-22 NOTE — Patient Instructions (Signed)
Please remember to keep your pain medications in a secure location and locked up. Take your pain medications as prescribed.

## 2011-06-22 NOTE — Progress Notes (Signed)
Subjective:    Patient ID: [name], female    DOB: 02-Dec-1917, 78 y.o.   MRN: [id number]  HPI  [name] is a pleasant 78 year old woman, who is accompanied by  her daughter to [facility]. She is  back in today for refill of her Percocet. She has chronic pain  complaints involving her neck. She has severe shoulder osteoarthritis  bilaterally, right knee osteoarthritis with a history of left knee  replacement. She also has lumbar spondylosis/spinal stenosis,  neurogenic claudication, and intermittent gout flare-ups.   She has had no gout flare ups in the interim.  No history of any fall since the last visit.  He continues to live independently, independent with dressing bathing toileting and light home making tasks.  Daughter helps out with her meals also.  She continues to use a cane for ambulation in a house. Is not to ambulate on uneven surfaces.   She reports no new problems. Her pain level averages about an 8 on a  scale of 10. Pain is described as sharp, burning, stabbing, dull,  tingling, and aching in nature.    Pain Inventory Average Pain 8 Pain Right Now 8 My pain is constant, sharp, burning, dull, stabbing, tingling and aching  In the last 24 hours, has pain interfered with the following? General activity 10 Relation with others 10 Enjoyment of life 10 What TIME of day is your pain at its worst? throughout the day Sleep (in general) Fair  Pain is worse with: walking, bending, sitting, inactivity and standing Pain improves with: rest, heat/ice, medication and TENS Relief from Meds: 8  Mobility walk with assistance use a cane use a walker how many minutes can you walk? 5 ability to climb steps?  no do you drive?  no use a wheelchair Do you have any goals in this area?  no  Function I need assistance with the following:  meal prep, household duties and shopping Do you have any goals in this area?   no  Neuro/Psych trouble walking  Prior Studies Any changes since last visit?  no  Physicians involved in your care Any changes since last visit?  no        Review of Systems  Constitutional: Negative.   HENT: Negative.   Eyes: Negative.   Respiratory: Negative.   Cardiovascular: Negative.   Gastrointestinal: Negative.   Genitourinary: Negative.   Musculoskeletal: Positive for myalgias, back pain and arthralgias.  Skin: Negative.   Neurological: Negative.   Hematological: Negative.   Psychiatric/Behavioral: Negative.        Objective:   Physical Exam  Constitutional: She appears well-developed and well-nourished.  Musculoskeletal:       Right shoulder: She exhibits decreased range of motion and pain.       Left shoulder: She exhibits decreased range of motion and pain.       Right knee: She exhibits normal range of motion. tenderness found. Medial joint line tenderness noted.       Cervical back: She exhibits decreased range of motion and pain.       Lumbar back: She exhibits decreased range of motion.  Neurological: She is alert. She has normal strength. No cranial nerve deficit or sensory deficit. Gait abnormal.  Reflex Scores:      Tricep reflexes are 0 on the right side and 0 on the left side.      Bicep reflexes are 0 on the right side and 0 on  the left side.      Brachioradialis reflexes are 1+ on the right side and 1+ on the left side.      Patellar reflexes are 0 on the right side and 0 on the left side.      Achilles reflexes are 0 on the right side and 0 on the left side.         Assessment & Plan:  IMPRESSION:  1. Cervical spondylosis, stable.( CT cervical spine 4/11: Advanced cervical spondylosis and kyphosis)  2. Severe shoulder osteoarthritis bilaterally, stable.  3. Right knee osteoarthritis and history of left knee replacement,  stable.  4. Lumbar spondylosis/spinal stenosis/neurogenic claudication.  5. Intermittent gout flare-ups which  are managed by her primary care  Physician.  This has not been a problem recently.  The Opioid medication pill count was appropriate. No reported significant side effects of Opioid medications noted. No aberrant behavior noted. Patient cautioned regarding operation of machinery or vehicles. Patient understands risk and benefits of these medications. There is no indication or report of risk to self or others. Last UDS 05.11.2012 and was consistant.  Will refill meds, Percocet 5/325 not more than 5 per day.  F/U with nursing next mont for refill of meds.

## 2011-06-30 ENCOUNTER — Other Ambulatory Visit: Payer: Self-pay | Admitting: Cardiology

## 2011-07-08 ENCOUNTER — Other Ambulatory Visit: Payer: Self-pay | Admitting: Cardiology

## 2011-07-20 ENCOUNTER — Encounter: Payer: Self-pay | Admitting: Physical Medicine and Rehabilitation

## 2011-07-20 ENCOUNTER — Encounter: Payer: Medicare Other | Attending: Neurosurgery | Admitting: Physical Medicine and Rehabilitation

## 2011-07-20 VITALS — BP 114/72 | HR 76 | Resp 14 | Ht 60.0 in | Wt 124.0 lb

## 2011-07-20 DIAGNOSIS — M47812 Spondylosis without myelopathy or radiculopathy, cervical region: Secondary | ICD-10-CM | POA: Insufficient documentation

## 2011-07-20 DIAGNOSIS — G8929 Other chronic pain: Secondary | ICD-10-CM | POA: Insufficient documentation

## 2011-07-20 DIAGNOSIS — M129 Arthropathy, unspecified: Secondary | ICD-10-CM

## 2011-07-20 DIAGNOSIS — M19019 Primary osteoarthritis, unspecified shoulder: Secondary | ICD-10-CM

## 2011-07-20 DIAGNOSIS — M79609 Pain in unspecified limb: Secondary | ICD-10-CM | POA: Insufficient documentation

## 2011-07-20 DIAGNOSIS — M159 Polyosteoarthritis, unspecified: Secondary | ICD-10-CM | POA: Insufficient documentation

## 2011-07-20 DIAGNOSIS — M1990 Unspecified osteoarthritis, unspecified site: Secondary | ICD-10-CM

## 2011-07-20 DIAGNOSIS — M545 Low back pain, unspecified: Secondary | ICD-10-CM | POA: Insufficient documentation

## 2011-07-20 DIAGNOSIS — M47816 Spondylosis without myelopathy or radiculopathy, lumbar region: Secondary | ICD-10-CM

## 2011-07-20 DIAGNOSIS — IMO0002 Reserved for concepts with insufficient information to code with codable children: Secondary | ICD-10-CM

## 2011-07-20 DIAGNOSIS — M1711 Unilateral primary osteoarthritis, right knee: Secondary | ICD-10-CM

## 2011-07-20 DIAGNOSIS — M47817 Spondylosis without myelopathy or radiculopathy, lumbosacral region: Secondary | ICD-10-CM | POA: Insufficient documentation

## 2011-07-20 DIAGNOSIS — M1710 Unilateral primary osteoarthritis, unspecified knee: Secondary | ICD-10-CM

## 2011-07-20 DIAGNOSIS — M25519 Pain in unspecified shoulder: Secondary | ICD-10-CM | POA: Insufficient documentation

## 2011-07-20 MED ORDER — OXYCODONE-ACETAMINOPHEN 5-325 MG PO TABS: 1.0000 | ORAL_TABLET | ORAL | Status: DC | PRN

## 2011-07-20 NOTE — Progress Notes (Signed)
Subjective:    Patient ID: [name], female    DOB: 07/06/1917, 78 y.o.   MRN: [id number]  HPI  [name] is a pleasant 78 year old woman, who is accompanied by  her daughter to [facility]. She is  back in today for refill of her Percocet. She has chronic pain  complaints involving her neck. She has severe shoulder osteoarthritis  bilaterally, right knee osteoarthritis with a history of left knee  replacement. She also has lumbar spondylosis/spinal stenosis,  neurogenic claudication, and intermittent gout flare-ups.  She has had no gout flare ups in the interim.  No history of any fall since the last visit.  He continues to live independently, independent with dressing bathing toileting and light home making tasks.  Daughter helps out with her meals also.  She continues to use a cane for ambulation in a house. Is not to ambulate on uneven surfaces.  She reports no new problems. Her pain level averages about an 8 on a  scale of 10. Pain is described as sharp, burning, stabbing, dull,  tingling, and aching in nature.   Patient's daughter reports that is Righetti is gained approximately 18 pounds. Her ankles are swollen this morning. She is waiting for a call back from her primary care doctor at this time. Dr. Viann Fish.  Patient reports no falls or injuries. She continues to live independently. Intermittent problems with constipation. She's been using Senokot. She also has prune juice at home.   No problems with gout flare up over the last month    Pain Inventory Average Pain 7 Pain Right Now 8 My pain is sharp, burning, dull, stabbing, tingling and aching  In the last 24 hours, has pain interfered with the following? General activity 10 Relation with others 10 Enjoyment of life 10 What TIME of day is your pain at its worst? All Day Sleep (in general) Fair  Pain is worse with: walking, bending, sitting, inactivity and  standing Pain improves with: rest, heat/ice, therapy/exercise, medication and TENS Relief from Meds: 7  Mobility walk with assistance use a cane use a walker ability to climb steps?  no do you drive?  no use a wheelchair  Function Do you have any goals in this area?  no  Neuro/Psych No problems in this area  Prior Studies Any changes since last visit?  no  Physicians involved in your care Primary care Dr. Marjory Lies  Review of Systems  Constitutional: Negative.   HENT: Negative.   Eyes: Negative.   Respiratory: Negative.   Cardiovascular: Negative.   Gastrointestinal: Negative.   Genitourinary: Negative.   Musculoskeletal: Negative.   Skin: Negative.   Neurological: Negative.   Hematological: Negative.   Psychiatric/Behavioral: Negative.        Objective:   Physical Exam Constitutional: She appears well-developed and well-nourished  . Neurological: She is alert. She has normal strength. No cranial nerve deficit or sensory deficit.  Reflex Scores:  Tricep reflexes are 0 on the right side and 0 on the left side.  Bicep reflexes are 0 on the right side and 0 on the left side.  Brachioradialis reflexes are 1+ on the right side and 1+ on the left side.  Patellar reflexes are 0 on the right side and 0 on the left side.  Achilles reflexes are 0 on the right side and 0 on the left side.   5/5 strength bilateral lower extremities without focal deficit.   Musculoskeletal:  Right shoulder: She exhibits decreased range of motion and pain.  Left shoulder: She exhibits decreased range of motion and pain.  Right knee: She exhibits normal range of motion. tenderness found. Medial joint line tenderness noted.  Cervical back: She exhibits decreased range of motion and pain.  Lumbar back: She exhibits decreased range of motion.   Bilateral lower extremities evaluated pitting edema to midcalf  in bilateral legs. No erythema no point tenderness.  Cervical range of  motion generalized decreased range of motion unchanged  Shoulder range of motion decreased abduction to approximately 45. Decreased internal and as well as external rotation bilaterally  Decreased lumbar motion all planes.      Gait abnormal.   Slow careful short stride length tandem gait not evaluated due to safety concerns. Patient uses a cane     Assessment & Plan:  1. Cervical spondylosis, stable.( CT cervical spine 4/11: Advanced cervical spondylosis and kyphosis)  2. Severe shoulder osteoarthritis bilaterally, stable.  3. Right knee osteoarthritis and history of left knee replacement,  stable.  4. Lumbar spondylosis/spinal stenosis/neurogenic claudication.  5. Intermittent gout flare-ups which are managed by her primary care  Physician. This has not been a problem recently.    The Opioid medication pill count was appropriate. No reported significant side effects of Opioid medications noted. No aberrant behavior noted. Patient cautioned regarding operation of machinery or vehicles. Patient understands risk and benefits of these medications. There is no indication or report of risk to self or others. Last UDS 05.11.2012 and was consistant.  Will refill meds, Percocet 5/325 not more than 5 per day.  F/U with nursing next mont for refill of meds.  Subtle followup with cardiologist Dr. Donnie Aho for weight gain and lower extremity edema

## 2011-07-20 NOTE — Patient Instructions (Addendum)
Nursing visit in one month for a refill of pain medications.  Followup with Dreden Rivere in 2 months  Make sure you keep her pain medications locked up in a secure location.  Please followup with Dr. Donnie Aho for your weight gain and lower extremity edema.

## 2011-07-21 ENCOUNTER — Other Ambulatory Visit: Payer: Self-pay | Admitting: Cardiology

## 2011-07-21 ENCOUNTER — Ambulatory Visit
Admission: RE | Admit: 2011-07-21 | Discharge: 2011-07-21 | Disposition: A | Discharge status: Home / Self Care | Payer: Medicare Other | Source: Ambulatory Visit | Attending: Cardiology | Admitting: Cardiology

## 2011-07-21 DIAGNOSIS — I5032 Chronic diastolic (congestive) heart failure: Secondary | ICD-10-CM

## 2011-07-22 ENCOUNTER — Ambulatory Visit: Payer: Medicare Other | Admitting: Physical Medicine and Rehabilitation

## 2011-08-05 ENCOUNTER — Other Ambulatory Visit: Payer: Self-pay | Admitting: Cardiology

## 2011-08-05 NOTE — Progress Notes (Signed)
[name]  Date of visit:  08/05/2011 DOB:  12-Oct-1917    Age:  78 yrs. Medical record number:  [id number]     Account number:  [id number] Primary Care Provider: Evelena Peat ____________________________ CURRENT DIAGNOSES  1. Arrhythmia-Atrial Fibrillation  2. Congestive Heart Failure Chronic Diastolic  3. Hypertension-Essential (Benign)  4. Hyperlipidemia  5. Long Term Use Anticoagulant  6. Aortic Valve Disorder  7. Chronic Kidney Disease (Stage 3)  8. Congestive Heart Failure ____________________________ ALLERGIES  hydrochlorothiazide, Hyponatremia ____________________________ MEDICATIONS  1. Percocet 5-325 mg Tablet, PRN  2. Fish Oil 1,000 mg Capsule, 1 p.o. daily  3. Systane 0.4-0.3 % Drops, PRN  4. Senna with Docusate Sodium 8.6-50 mg Tablet, 1 p.o. daily  5. pantoprazole 40 mg Tablet, Delayed Release (E.C.), BID  6. warfarin 2 mg Tablet, Take as directed  7. Vitamin D 50,000 unit Capsule, weekly  8. Colcrys 0.6 mg tablet, PRN  9. carvedilol 6.25 mg tablet, BID  10. diltiazem HCl 120 mg tablet extended release 24 hr, 1 p.o. daily  11. losartan 100 mg tablet, 1 p.o. daily  12. furosemide 40 mg tablet, 2 in the morning and one at night or as directed 2 p.o. b.i.d. ____________________________ CHIEF COMPLAINTS  Followup of Arrhythmia-Atrial Fibrillation  Followup of Congestive Heart Failure ____________________________ HISTORY OF PRESENT ILLNESS  Patient seen for cardiac followup. Since she was previously here she has lost over 8 pounds and her edema is now gone. Her chest x-ray showed relatively clear lung fields but cardiomegaly. She had evidence of calcification in her vascular system also. She is not short of breath actively. She has a moderate amount of malaise and fatigue. She denies PND, orthopnea, syncope, or claudication. She was unable to get measured for support stockings.  ____________________________ PAST HISTORY  Past Medical Illnesses:  hypertension,  hyperlipidemia, obesity, hypothyroidism, severe osteoarthritis, history of GERD, gout, diverticulosis, chronic kidney disease Stage 3, history of hyperthyroidism treated with RAI 2007, esophagitis 2012, cervical disc disease, lumbar disc disease;  Cardiovascular Illnesses:  aortic sclerosis, aortic insufficiency;  Surgical Procedures:  breast lumpectomy, D and C, knee surgery, left, knee replacement-left;  NYHA Classification:  II;  Cardiology Procedures-Invasive:  no history of prior cardiac procedures;  Cardiology Procedures-Noninvasive:  adenosine cardiolite August 2005, echocardiogram March 2012, echocardiogram April 2013;  LVEF of 55% documented via echocardiogram on 06/06/2010 ____________________________ CARDIO-PULMONARY TEST DATES EKG Date:  07/21/2011;  Nuclear Study Date:  11/22/2003;  Echocardiography Date: 07/22/2011;  Chest Xray Date: 07/21/2011;   ____________________________ SOCIAL HISTORY Alcohol Use:  no alcohol use;  Smoking:  never smoked;  Diet:  regular diet;  Lifestyle:  widowed;  Exercise:  no regular exercise;  Occupation:  retired and Airline pilot;  Residence:  lives alone;   ____________________________ REVIEW OF SYSTEMS General:  malaise and fatigue  Eyes:  cataract extraction O.D., wears eye glasses/contact lenses Respiratory:  moderate dyspnea with exertion Cardiovascular:  please review HPI  Abdominal:  denies dyspepsia, GI bleeding, constipation, or diarrhea  Genitourinary-Female:  nocturia, stress incontinence  Musculoskeletal:  arthritis of the shoulder, arthritis of the knees, chronic low back pain Psychiatric:  insomnia ____________________________ PHYSICAL EXAMINATION VITAL SIGNS  Blood Pressure:  154/92 Sitting, Left arm, regular cuff  , 160/90 Standing, Left arm and regular cuff   Pulse:  82/min. Weight:  118.00 lbs. Height:  60"BMI: 23  Constitutional:  elderly, pleasant white female, in no acute distress walks with cane Head:  normocephalic, normal hair pattern, no  masses or tenderness ENT:  ears, nose  and throat reveal no gross abnormalities.  Dentition good. Neck:  JVD present, no palpable masses or adenopathy Chest:  normal symmetry, clear to auscultation and percussion. Cardiac:  irregular rhythm, normal S1 and S2, no S3 or S4, grade 2/6 systolic murmur at aortic area radiating to neck Peripheral Pulses:  the femoral,dorsalis pedis, and posterior tibial pulses are full and equal bilaterally with no bruits auscultated. Extremities & Back:  trace edema Neurological:  no gross motor or sensory deficits noted, affect appropriate, oriented x3. ____________________________ IMPRESSIONS/PLAN  1. Diastolic heart failure currently better on higher dose diuretics 2. Stage III chronic kidney disease 3. Chronic atrial fibrillation 4. Long-term anticoagulation with warfarin 5. Hypertensive heart disease 6. Vascular calcification noted on x-ray  Recommendations:  She should get fitted for venous support stockings. She is clinically better and checked a BMP today. She may reduce her furosemide to 80 mg in the morning and 40 in the evening. She should weigh every day and increase her diuretics to 80 mg twice a day for weight goes up more than 3 pounds. Return visit in one month. ____________________________ TODAYS ORDERS  1. Return Visit: 1 month  2. Basic Metabolic Panel: Today                       ____________________________ Cardiology Physician:  Darden Palmer MD [facility]

## 2011-08-19 ENCOUNTER — Encounter
Payer: Medicare Other | Attending: Physical Medicine and Rehabilitation | Admitting: Physical Medicine and Rehabilitation

## 2011-08-19 ENCOUNTER — Encounter: Payer: Self-pay | Admitting: Physical Medicine and Rehabilitation

## 2011-08-19 ENCOUNTER — Ambulatory Visit: Payer: Medicare Other | Admitting: Physical Medicine and Rehabilitation

## 2011-08-19 VITALS — BP 104/64 | HR 72 | Resp 14 | Ht 60.0 in | Wt 116.0 lb

## 2011-08-19 DIAGNOSIS — G8929 Other chronic pain: Secondary | ICD-10-CM | POA: Insufficient documentation

## 2011-08-19 DIAGNOSIS — M542 Cervicalgia: Secondary | ICD-10-CM | POA: Insufficient documentation

## 2011-08-19 DIAGNOSIS — Z8639 Personal history of other endocrine, nutritional and metabolic disease: Secondary | ICD-10-CM | POA: Insufficient documentation

## 2011-08-19 DIAGNOSIS — M1710 Unilateral primary osteoarthritis, unspecified knee: Secondary | ICD-10-CM | POA: Insufficient documentation

## 2011-08-19 DIAGNOSIS — M479 Spondylosis, unspecified: Secondary | ICD-10-CM

## 2011-08-19 DIAGNOSIS — M47812 Spondylosis without myelopathy or radiculopathy, cervical region: Secondary | ICD-10-CM | POA: Insufficient documentation

## 2011-08-19 DIAGNOSIS — M19019 Primary osteoarthritis, unspecified shoulder: Secondary | ICD-10-CM | POA: Insufficient documentation

## 2011-08-19 DIAGNOSIS — M1990 Unspecified osteoarthritis, unspecified site: Secondary | ICD-10-CM

## 2011-08-19 DIAGNOSIS — M47817 Spondylosis without myelopathy or radiculopathy, lumbosacral region: Secondary | ICD-10-CM | POA: Insufficient documentation

## 2011-08-19 DIAGNOSIS — Z862 Personal history of diseases of the blood and blood-forming organs and certain disorders involving the immune mechanism: Secondary | ICD-10-CM | POA: Insufficient documentation

## 2011-08-19 MED ORDER — OXYCODONE-ACETAMINOPHEN 5-325 MG PO TABS: 1.0000 | ORAL_TABLET | ORAL | Status: DC | PRN

## 2011-08-19 NOTE — Patient Instructions (Signed)
Continue with medication, continue to do some exercises from your work out sheet.

## 2011-08-19 NOTE — Progress Notes (Signed)
Subjective:    Patient ID: [name], female    DOB: [birth year], 78 y.o.   MRN: [id number]  HPI [name] is a pleasant 78 year old woman, who is accompanied by  her daughter to [facility]. She is  back in today for refill of her Percocet. She has chronic pain  complaints involving her neck. She has severe shoulder osteoarthritis  bilaterally, right knee osteoarthritis with a history of left knee  replacement. She also has lumbar spondylosis/spinal stenosis,  neurogenic claudication, and intermittent gout flare-ups.  She has had no gout flare ups in the interim.  No history of any fall since the last visit.  She continues to live independently, independent with dressing bathing toileting and light home making tasks.  Daughter helps out with her meals also.  She continues to use a cane for ambulation in a house. Is not to ambulate on uneven surfaces.  She reports no new problems.  Pain Inventory Average Pain 8 Pain Right Now 9 My pain is sharp, burning, dull, stabbing, tingling and aching  In the last 24 hours, has pain interfered with the following? General activity 10 Relation with others 10 Enjoyment of life 10 What TIME of day is your pain at its worst? All Day Sleep (in general) Fair  Pain is worse with: walking, bending, sitting, inactivity and standing Pain improves with: rest, heat/ice, therapy/exercise, pacing activities, medication and TENS Relief from Meds: 5  Mobility use a cane use a walker use a wheelchair needs help with transfers  Function retired  Neuro/Psych No problems in this area  Prior Studies Any changes since last visit?  no  Physicians involved in your care Primary care Dr. Marjory Lies  Review of Systems  Constitutional: Negative.   HENT: Negative.   Eyes: Negative.   Respiratory: Negative.   Cardiovascular: Positive for leg swelling.  Gastrointestinal: Negative.   Genitourinary:  Negative.   Musculoskeletal: Positive for back pain.  Skin: Negative.   Neurological: Negative.   Hematological: Negative.   Psychiatric/Behavioral: Negative.        Objective:   Physical Exam  Constitutional: She appears well-developed.  HENT:  Head: Normocephalic.  Skin: Skin is warm and dry.  Psychiatric: She has a normal mood and affect.  Symmetric normal motor tone is noted throughout. General decrease in muscle bulk. Muscle testing reveals 5/5 muscle strength of the upper extremity, and 5/5 of the lower extremity. Full range of motion in upper and lower extremities. ROM of cervical  spine is  restricted. ROM of left knee restricted extension by 10 degrees.  DTR in the upper extremity are present and symmetric 2+, lower extremities could not elicit, but symmetrical. No clonus is noted.  Patient arises from chair with difficulty. Wide  based gait with cane. Walking on heels and toes and Tandem walk not tested due to safety .          Assessment & Plan:  1. Cervical spondylosis, stable.( CT cervical spine 4/11: Advanced cervical spondylosis and kyphosis)  2. Severe shoulder osteoarthritis bilaterally, stable.  3. Right knee osteoarthritis and history of left knee replacement,  stable.  4. Lumbar spondylosis/spinal stenosis/neurogenic claudication.  5. Intermittent gout flare-ups which are managed by her primary care  Physician. This has not been a problem recently. 6. Advised patient to continue her home exercise program in a sitting or recumbant position.  The Opioid medication pill count was appropriate. No reported significant side effects of Opioid medications  noted. No aberrant behavior noted. Patient cautioned regarding operation of machinery or vehicles. Patient understands risk and benefits of these medications. There is no indication or report of risk to self or others. Last UDS 05.11.2012 and was consistant.  Will refill meds, Percocet 5/325 not more than 5 per  day.

## 2011-09-02 ENCOUNTER — Other Ambulatory Visit: Payer: Self-pay | Admitting: Cardiology

## 2011-09-02 NOTE — Progress Notes (Signed)
[name]  Date of visit:  09/02/2011 DOB:  1917-04-28    Age:  78 yrs. Medical record number:  [id number]     Account number:  [id number] Primary Care Provider: Evelena Peat ____________________________ CURRENT DIAGNOSES  1. Arrhythmia-Atrial Fibrillation  2. Congestive Heart Failure Chronic Diastolic  3. Hypertension-Essential (Benign)  4. Hyperlipidemia  5. Long Term Use Anticoagulant  6. Aortic Valve Disorder  7. Chronic Kidney Disease (Stage 3)  8. Congestive Heart Failure ____________________________ ALLERGIES  hydrochlorothiazide, Hyponatremia ____________________________ MEDICATIONS  1. Percocet 5-325 mg Tablet, PRN  2. Fish Oil 1,000 mg Capsule, 1 p.o. daily  3. Systane 0.4-0.3 % Drops, PRN  4. Senna with Docusate Sodium 8.6-50 mg Tablet, 1 p.o. daily  5. pantoprazole 40 mg Tablet, Delayed Release (E.C.), BID  6. warfarin 2 mg Tablet, Take as directed  7. Vitamin D 50,000 unit Capsule, weekly  8. Colcrys 0.6 mg tablet, PRN  9. carvedilol 6.25 mg tablet, BID  10. diltiazem HCl 120 mg tablet extended release 24 hr, 1 p.o. daily  11. losartan 100 mg tablet, 1 p.o. daily  12. furosemide 40 mg tablet, 2 in the morning and one at night or as directed 2 p.o. b.i.d. ____________________________ CHIEF COMPLAINTS  Followup of Arrhythmia-Atrial Fibrillation  Followup of Congestive Heart Failure ____________________________ HISTORY OF PRESENT ILLNESS  Patient seen for cardiac followup. She is doing good from a cardiac viewpoint and is largely keeping the edema away with keeping her legs elevated as well as a higher dose of diuretics. She is no longer having dyspnea. She will be 61 on her next birthday. She denies PND, orthopnea, syncope, or claudication. She has a lot of difficulty with pain and has been seeing a pain clinic. She tried to get support stockings but found it difficult to put them on. ____________________________ PAST HISTORY  Past Medical Illnesses:   hypertension, hyperlipidemia, obesity, hypothyroidism, severe osteoarthritis, history of GERD, gout, diverticulosis, chronic kidney disease Stage 3, history of hyperthyroidism treated with RAI 2007, esophagitis 2012, cervical disc disease, lumbar disc disease;  Cardiovascular Illnesses:  aortic sclerosis, aortic insufficiency, atrial fibrillation;  Surgical Procedures:  breast lumpectomy, D and C, knee surgery, left, knee replacement-left;  NYHA Classification:  II;  Cardiology Procedures-Invasive:  no history of prior cardiac procedures;  Cardiology Procedures-Noninvasive:  adenosine cardiolite August 2005, echocardiogram March 2012, echocardiogram April 2013;  LVEF of 55% documented via echocardiogram on 06/06/2010   CHADS Score:  3 ____________________________ CARDIO-PULMONARY TEST DATES EKG Date:  07/21/2011;  Nuclear Study Date:  11/22/2003;   Echocardiography Date: 07/22/2011;  Chest Xray Date: 07/21/2011;   ____________________________ SOCIAL HISTORY Alcohol Use:  no alcohol use;  Smoking:  never smoked;  Diet:  regular diet;  Lifestyle:  widowed;  Exercise:  no regular exercise;  Occupation:  retired and Airline pilot;  Residence:  lives alone;   ____________________________ REVIEW OF SYSTEMS General:  malaise and fatigue  Eyes:  cataract extraction O.D., wears eye glasses/contact lenses  Respiratory:  moderate dyspnea with exertion  Cardiovascular:  please review HPI  Abdominal:  denies dyspepsia, GI bleeding, constipation, or diarrhea  Genitourinary-Female:  nocturia, stress incontinence  Musculoskeletal:  arthritis of the shoulder, arthritis of the knees, chronic low back pain  Psychiatric:  insomnia ____________________________ PHYSICAL EXAMINATION VITAL SIGNS  Blood Pressure:  140/86 Sitting, Left arm, regular cuff  , 136/80 Standing, Left arm and regular cuff   Pulse:  82/min. Weight:  116.00 lbs. Height:  60"BMI: 22  Constitutional:  elderly, pleasant white female, in no  acute distress walks  with cane Head:  normocephalic, normal hair pattern, no masses or tenderness ENT:  ears, nose and throat reveal no gross abnormalities.  Dentition good. Neck:  JVD present, no palpable masses or adenopathy Chest:  normal symmetry, clear to auscultation and percussion. Cardiac:  irregular rhythm, normal S1 and S2, no S3 or S4, grade 2/6 systolic murmur at aortic area radiating to neck Peripheral Pulses:  the femoral,dorsalis pedis, and posterior tibial pulses are full and equal bilaterally with no bruits auscultated. Extremities & Back:  trace edema Neurological:  no gross motor or sensory deficits noted, affect appropriate, oriented x3. ____________________________ MOST RECENT LIPID PANEL 12/25/02  CHOL TOTL 205 mg/dl, LDL 161 NM, HDL 58 mg/dl, TRIGLYCER 096 mg/dl, ALT 18 u/l, ALK PHOS 45 u/l and AST 19 u/l ____________________________ IMPRESSIONS/PLAN  1. Diastolic congestive heart failure currently compensated 2. Hypertensive heart disease-borderline control 3. Atrial fibrillation controlled 4. Long-term anticoagulation with warfarin  Recommendations:  Her edema is really much better. We talked about the importance of weighing daily and watching for edema. She may take an extra furosemide at bedtime if her weight increases. Recommended followup again in 3 months. Metabolic panel and BNP done today. ____________________________ TODAYS ORDERS  1. Return Visit: 3 months  2. Basic Metabolic Panel: Today  3. BNP: Today                       ____________________________ Cardiology Physician:  Darden Palmer MD [facility]

## 2011-09-28 ENCOUNTER — Encounter: Payer: Medicare Other | Attending: Neurosurgery | Admitting: Physical Medicine and Rehabilitation

## 2011-09-28 ENCOUNTER — Encounter: Payer: Self-pay | Admitting: Physical Medicine and Rehabilitation

## 2011-09-28 VITALS — BP 118/78 | HR 72 | Resp 14 | Ht 59.0 in | Wt 117.0 lb

## 2011-09-28 DIAGNOSIS — M19029 Primary osteoarthritis, unspecified elbow: Secondary | ICD-10-CM

## 2011-09-28 DIAGNOSIS — M1711 Unilateral primary osteoarthritis, right knee: Secondary | ICD-10-CM

## 2011-09-28 DIAGNOSIS — M47812 Spondylosis without myelopathy or radiculopathy, cervical region: Secondary | ICD-10-CM

## 2011-09-28 DIAGNOSIS — M1710 Unilateral primary osteoarthritis, unspecified knee: Secondary | ICD-10-CM

## 2011-09-28 DIAGNOSIS — IMO0002 Reserved for concepts with insufficient information to code with codable children: Secondary | ICD-10-CM | POA: Insufficient documentation

## 2011-09-28 DIAGNOSIS — R2689 Other abnormalities of gait and mobility: Secondary | ICD-10-CM

## 2011-09-28 DIAGNOSIS — M48062 Spinal stenosis, lumbar region with neurogenic claudication: Secondary | ICD-10-CM | POA: Insufficient documentation

## 2011-09-28 DIAGNOSIS — M48061 Spinal stenosis, lumbar region without neurogenic claudication: Secondary | ICD-10-CM | POA: Insufficient documentation

## 2011-09-28 DIAGNOSIS — R269 Unspecified abnormalities of gait and mobility: Secondary | ICD-10-CM | POA: Insufficient documentation

## 2011-09-28 MED ORDER — OXYCODONE-ACETAMINOPHEN 5-325 MG PO TABS: 1.0000 | ORAL_TABLET | ORAL | Status: DC | PRN

## 2011-09-28 NOTE — Patient Instructions (Addendum)
I have ordered home health physical therapy to help prevent falls in your home. Will also have them assess your current walker consider a narrower one or you can get around easier.  I would like to see you doing a home exercise program daily to help maintain your strength, endurance, flexibility.  Please remember to use her walker instead of a cane to help prevent falls  These keep your pain medications locked up and in a secure location  Visit with PA in one month  Followup with Malissia Rabbani 2 months

## 2011-09-28 NOTE — Progress Notes (Signed)
Subjective:    Patient ID: [name], female    DOB: [birth year], 78 y.o.   MRN: [id number]  HPI  The patient is a 78 year old woman who lives independently. She is seen here for chronic pain which is related to severe osteoarthritis of her neck, low back, shoulders, bilateral knees. She has a history of the left knee replacement. She has known lumbar spondyosis and spinal stenosis. She also has neurogenic claudication which limits her ambulation capacity.   Her daughter accompanies her and is in the room with her permission. Daughter reports that patient had a fall earlier this month and was seen by [facility]. Knee was x-rayed injection given in right knee. Continued hip pain seen a [place] [facility] 09/09/2011 hip x-ray followed by a left hip injection per daughters report. Daughter's report was outlined in a note.  Concern here for recent falls.     Pain Inventory Average Pain 8 Pain Right Now 8 My pain is sharp, burning, dull, stabbing, tingling and aching  In the last 24 hours, has pain interfered with the following? General activity 10 Relation with others 10 Enjoyment of life 10 What TIME of day is your pain at its worst? All Day Sleep (in general) Fair  Pain is worse with: walking, bending, sitting, inactivity and standing Pain improves with: rest, heat/ice, medication, TENS and injections Relief from Meds: 8  Mobility walk with assistance  Function retired  Neuro/Psych No problems in this area  Prior Studies Any changes since last visit?  no  Physicians involved in your care Primary care Dr. Marjory Lies   Family History  Problem Relation Age of Onset  . Diabetes Sister   . Heart disease Sister    History   Social History  . Marital Status: Widowed    Spouse Name: N/A    Number of Children: N/A  . Years of Education: N/A   Social History Main Topics  . Smoking status: Never Smoker   . Smokeless tobacco: None  . Alcohol Use:  No  . Drug Use: No  . Sexually Active: None   Other Topics Concern  . None   Social History Narrative  . None   Past Surgical History  Procedure Date  . Breast lumpectomy   . Knee surgery   . Breast lumpectomy    Past Medical History  Diagnosis Date  . Hypertension   . Atrial fibrillation    BP 118/78  Pulse 72  Resp 14  Ht 4\' 11"  (1.499 m)  Wt 117 lb (53.071 kg)  BMI 23.63 kg/m2  SpO2 97%      Review of Systems  Constitutional: Negative.   HENT: Positive for neck pain.   Eyes: Negative.   Respiratory: Negative.   Cardiovascular: Negative.   Gastrointestinal: Negative.   Genitourinary: Negative.   Musculoskeletal: Positive for gait problem.  Skin: Negative.   Neurological: Negative.   Hematological: Negative.   Psychiatric/Behavioral: Negative.        Objective:   Physical Exam The patient is an elderly woman who does not appear in any distress. The patient is oriented x3. She's alert and cooperative. She follows commands without difficulty and answers questions appropriately.  Cranial nerves are grossly intact with the exception of hearing. Coordination is grossly intact. Reflexes are diminished in upper and lower extremities without abnormal tone clonus or tremors.  Motor strength is generally good in both upper and lower extremities without obvious focal deficit.  Significant limitations in range of motion are  noted in her neck, bilateral shoulders and low back.  She exits her chair slowly and carefully. Gait is mildly unstable without her cane. She complains of knee pain transitioning from sit to stand.        Assessment & Plan:  1. Cervical spondylosis, stable.( CT cervical spine 4/11: Advanced cervical spondylosis and kyphosis)  2. Severe shoulder osteoarthritis bilaterally, stable.  3. Right knee osteoarthritis and history of left knee replacement,  stable.  4. Lumbar spondylosis/spinal stenosis/neurogenic claudication.  5. Intermittent gout  flare-ups which are managed by her primary care  Physician. This has not been a problem recently.    The Opioid medication pill count was appropriate. No reported significant side effects of Opioid medications noted. No aberrant behavior noted. Patient cautioned regarding operation of machinery or vehicles. Patient understands risk and benefits of these medications. There is no indication or report of risk to self or others. Last UDS 05.11.2012 and was consistant.  Will refill meds, Percocet 5/325 not more than 5 per day.  F/U with nursing next mont for refill of meds.

## 2011-09-30 ENCOUNTER — Other Ambulatory Visit: Payer: Self-pay | Admitting: Cardiology

## 2011-11-02 ENCOUNTER — Encounter
Payer: Medicare Other | Attending: Physical Medicine and Rehabilitation | Admitting: Physical Medicine and Rehabilitation

## 2011-11-02 ENCOUNTER — Other Ambulatory Visit: Payer: Self-pay | Admitting: Physical Medicine and Rehabilitation

## 2011-11-02 ENCOUNTER — Encounter: Payer: Self-pay | Admitting: Physical Medicine and Rehabilitation

## 2011-11-02 VITALS — BP 140/90 | HR 63 | Resp 16 | Ht 59.0 in | Wt 118.0 lb

## 2011-11-02 DIAGNOSIS — I10 Essential (primary) hypertension: Secondary | ICD-10-CM | POA: Insufficient documentation

## 2011-11-02 DIAGNOSIS — M47816 Spondylosis without myelopathy or radiculopathy, lumbar region: Secondary | ICD-10-CM

## 2011-11-02 DIAGNOSIS — M159 Polyosteoarthritis, unspecified: Secondary | ICD-10-CM | POA: Insufficient documentation

## 2011-11-02 DIAGNOSIS — M47812 Spondylosis without myelopathy or radiculopathy, cervical region: Secondary | ICD-10-CM

## 2011-11-02 DIAGNOSIS — M1990 Unspecified osteoarthritis, unspecified site: Secondary | ICD-10-CM

## 2011-11-02 DIAGNOSIS — M47817 Spondylosis without myelopathy or radiculopathy, lumbosacral region: Secondary | ICD-10-CM | POA: Insufficient documentation

## 2011-11-02 DIAGNOSIS — Z96659 Presence of unspecified artificial knee joint: Secondary | ICD-10-CM | POA: Insufficient documentation

## 2011-11-02 DIAGNOSIS — G8929 Other chronic pain: Secondary | ICD-10-CM | POA: Insufficient documentation

## 2011-11-02 DIAGNOSIS — M109 Gout, unspecified: Secondary | ICD-10-CM | POA: Insufficient documentation

## 2011-11-02 MED ORDER — OXYCODONE-ACETAMINOPHEN 5-325 MG PO TABS: 1.0000 | ORAL_TABLET | ORAL | Status: DC | PRN

## 2011-11-02 NOTE — Patient Instructions (Signed)
Continue with exercising in a sitting position as tolerated.

## 2011-11-02 NOTE — Progress Notes (Signed)
Subjective:    Patient ID: [name], female    DOB: 06/05/1917, 78 y.o.   MRN: [id number]  HPI The patient is a 78 year old woman who lives independently. She is seen here for chronic pain which is related to severe osteoarthritis of her neck, low back, shoulders, bilateral knees. She has a history of the left knee replacement. She has known lumbar spondyosis and spinal stenosis. She also has neurogenic claudication which limits her ambulation capacity. The problem has been stable, she did not have any new falls since her last visit.  Pain Inventory Average Pain 8 Pain Right Now 8 My pain is sharp, burning, dull, stabbing, tingling and aching  In the last 24 hours, has pain interfered with the following? General activity 6 Relation with others 6 Enjoyment of life 6 What TIME of day is your pain at its worst? varies Sleep (in general) Fair  Pain is worse with: walking, bending, sitting, inactivity and standing Pain improves with: rest, heat/ice, medication and TENS Relief from Meds: 6  Mobility walk with assistance use a walker how many minutes can you walk? not far ability to climb steps?  no do you drive?  no use a wheelchair Do you have any goals in this area?  no  Function Do you have any goals in this area?  no  Neuro/Psych No problems in this area  Prior Studies Any changes since last visit?  no  Physicians involved in your care Any changes since last visit?  no   Family History  Problem Relation Age of Onset  . Diabetes Sister   . Heart disease Sister    History   Social History  . Marital Status: Widowed    Spouse Name: N/A    Number of Children: N/A  . Years of Education: N/A   Social History Main Topics  . Smoking status: Never Smoker   . Smokeless tobacco: None  . Alcohol Use: No  . Drug Use: No  . Sexually Active: None   Other Topics Concern  . None   Social History Narrative  . None   Past Surgical History  Procedure Date  .  Breast lumpectomy   . Knee surgery   . Breast lumpectomy    Past Medical History  Diagnosis Date  . Hypertension   . Atrial fibrillation    BP 140/90  Pulse 63  Resp 16  Ht 4\' 11"  (1.499 m)  Wt 118 lb (53.524 kg)  BMI 23.83 kg/m2  SpO2 97%     Review of Systems  HENT: Positive for neck pain.   Musculoskeletal: Positive for myalgias, back pain, arthralgias and gait problem.  All other systems reviewed and are negative.       Objective:   Physical Exam Constitutional: She appears well-developed.  HENT:  Head: Normocephalic.  Skin: Skin is warm and dry.  Psychiatric: She has a normal mood and affect.  Symmetric normal motor tone is noted throughout. General decrease in muscle bulk. Muscle testing reveals 5/5 muscle strength of the upper extremity, and 5/5 of the lower extremity. Full range of motion in upper and lower extremities. ROM of cervical spine is restricted. ROM of left knee restricted extension by 10 degrees.  DTR in the upper extremity are present and symmetric 2+, lower extremities could not elicit, but symmetrical. No clonus is noted.  Patient arises from chair with difficulty. She is walking with a walker now, which gives her more stability. Walking on heels and toes and Tandem  walk not tested due to safety .         Assessment & Plan:  1. Cervical spondylosis, stable.( CT cervical spine 4/11: Advanced cervical spondylosis and kyphosis)  2. Severe shoulder osteoarthritis bilaterally, stable.  3. Right knee osteoarthritis and history of left knee replacement,  stable.  4. Lumbar spondylosis/spinal stenosis/neurogenic claudication.  5. Intermittent gout flare-ups which are managed by her primary care  Physician. This has not been a problem recently.  6. Advised patient to continue her home exercise program in a sitting position.  The Opioid medication pill count was appropriate. No reported significant side effects of Opioid medications noted. No aberrant  behavior noted. Patient cautioned regarding operation of machinery or vehicles. Patient understands risk and benefits of these medications. There is no indication or report of risk to self or others.   Will refill meds, Percocet 5/325 not more than 5 per day.

## 2011-11-13 ENCOUNTER — Encounter: Payer: Self-pay | Admitting: Physical Medicine and Rehabilitation

## 2011-11-25 ENCOUNTER — Telehealth: Payer: Self-pay | Admitting: Physical Medicine and Rehabilitation

## 2011-11-25 MED ORDER — OXYCODONE-ACETAMINOPHEN 5-325 MG PO TABS: 1.0000 | ORAL_TABLET | ORAL | Status: DC | PRN

## 2011-11-25 NOTE — Telephone Encounter (Signed)
Script ready for pickup pt aware.

## 2011-11-25 NOTE — Telephone Encounter (Signed)
Needs refill on Percocet before appt.

## 2011-12-07 ENCOUNTER — Ambulatory Visit: Payer: Medicare Other | Admitting: Physical Medicine and Rehabilitation

## 2011-12-07 DIAGNOSIS — R269 Unspecified abnormalities of gait and mobility: Secondary | ICD-10-CM

## 2011-12-07 DIAGNOSIS — IMO0002 Reserved for concepts with insufficient information to code with codable children: Secondary | ICD-10-CM

## 2011-12-07 DIAGNOSIS — Z0271 Encounter for disability determination: Secondary | ICD-10-CM

## 2011-12-07 DIAGNOSIS — M4800 Spinal stenosis, site unspecified: Secondary | ICD-10-CM

## 2011-12-07 DIAGNOSIS — M1710 Unilateral primary osteoarthritis, unspecified knee: Secondary | ICD-10-CM

## 2011-12-28 ENCOUNTER — Encounter: Payer: Self-pay | Admitting: Physical Medicine and Rehabilitation

## 2011-12-28 ENCOUNTER — Encounter: Payer: Medicare Other | Attending: Neurosurgery | Admitting: Physical Medicine and Rehabilitation

## 2011-12-28 VITALS — BP 110/66 | HR 62 | Resp 14 | Ht 60.0 in | Wt 115.0 lb

## 2011-12-28 DIAGNOSIS — M47816 Spondylosis without myelopathy or radiculopathy, lumbar region: Secondary | ICD-10-CM

## 2011-12-28 DIAGNOSIS — M19029 Primary osteoarthritis, unspecified elbow: Secondary | ICD-10-CM

## 2011-12-28 DIAGNOSIS — M47817 Spondylosis without myelopathy or radiculopathy, lumbosacral region: Secondary | ICD-10-CM

## 2011-12-28 DIAGNOSIS — M48062 Spinal stenosis, lumbar region with neurogenic claudication: Secondary | ICD-10-CM

## 2011-12-28 DIAGNOSIS — IMO0002 Reserved for concepts with insufficient information to code with codable children: Secondary | ICD-10-CM | POA: Insufficient documentation

## 2011-12-28 DIAGNOSIS — M47812 Spondylosis without myelopathy or radiculopathy, cervical region: Secondary | ICD-10-CM

## 2011-12-28 DIAGNOSIS — M48061 Spinal stenosis, lumbar region without neurogenic claudication: Secondary | ICD-10-CM | POA: Insufficient documentation

## 2011-12-28 DIAGNOSIS — R269 Unspecified abnormalities of gait and mobility: Secondary | ICD-10-CM | POA: Insufficient documentation

## 2011-12-28 DIAGNOSIS — M1710 Unilateral primary osteoarthritis, unspecified knee: Secondary | ICD-10-CM

## 2011-12-28 MED ORDER — OXYCODONE-ACETAMINOPHEN 5-325 MG PO TABS: 1.0000 | ORAL_TABLET | ORAL | Status: DC | PRN

## 2011-12-28 NOTE — Progress Notes (Signed)
Subjective:    Patient ID: [name], female    DOB: [birth year], 78 y.o.   MRN: [id number]  HPI The patient is a 78 year old woman who lives with her daughter. She is seen here for chronic pain which is related to severe osteoarthritis of her neck, low back, shoulders, bilateral knees. She has a history of the left knee replacement. She has known lumbar spondyosis and spinal stenosis. She also has neurogenic claudication which limits her ambulation capacity.   Her daughter accompanies her and is in the room with her permission.  She is here today for a refill of her Percocet 5/325 and drug monitoring.     Pain Inventory Average Pain 8 Pain Right Now 8 My pain is sharp, burning, dull, stabbing and aching  In the last 24 hours, has pain interfered with the following? General activity 10 Relation with others 10 Enjoyment of life 10 What TIME of day is your pain at its worst? All Day Sleep (in general) Fair  Pain is worse with: walking, bending, sitting, inactivity and standing Pain improves with: rest, heat/ice and medication Relief from Meds: 6  Mobility walk with assistance use a walker ability to climb steps?  no do you drive?  no use a wheelchair  Function retired  Neuro/Psych No problems in this area  Prior Studies Any changes since last visit?  no  Physicians involved in your care Any changes since last visit?  no   Family History  Problem Relation Age of Onset  . Diabetes Sister   . Heart disease Sister    History   Social History  . Marital Status: Widowed    Spouse Name: N/A    Number of Children: N/A  . Years of Education: N/A   Social History Main Topics  . Smoking status: Never Smoker   . Smokeless tobacco: None  . Alcohol Use: No  . Drug Use: No  . Sexually Active: None   Other Topics Concern  . None   Social History Narrative  . None   Past Surgical History  Procedure Date  . Breast lumpectomy   . Knee surgery   . Breast  lumpectomy    Past Medical History  Diagnosis Date  . Hypertension   . Atrial fibrillation    There were no vitals taken for this visit.      Review of Systems  Constitutional: Negative.   HENT: Positive for neck pain and neck stiffness.   Eyes: Negative.   Respiratory: Negative.   Cardiovascular: Negative.   Gastrointestinal: Negative.   Genitourinary: Negative.   Musculoskeletal: Positive for myalgias, back pain, arthralgias and gait problem.  Skin: Negative.   Neurological: Negative.   Hematological: Negative.   Psychiatric/Behavioral: Negative.        Objective:   Physical Exam The patient is an elderly woman who does not appear in any distress. The patient is oriented x3. She's alert and cooperative. She follows commands without difficulty and answers questions appropriately.  Cranial nerves are grossly intact with the exception of hearing. Coordination is grossly intact. Reflexes are diminished in upper and lower extremities without abnormal tone clonus or tremors.  Motor strength is generally good in both upper and lower extremities without obvious focal deficit.  Significant limitations in range of motion are noted in her neck, bilateral shoulders and low back.  She exits her chair slowly and carefully. Gait is mildly unstable walker. She complains of knee pain transitioning from sit to stand.  Assessment & Plan:  1. Cervical spondylosis, stable.( CT cervical spine 4/11: Advanced cervical spondylosis and kyphosis)   2. Severe shoulder osteoarthritis bilaterally, stable.   3. Right knee osteoarthritis and history of left knee replacement,  stable.   4. Lumbar spondylosis/spinal stenosis/neurogenic claudication.   5. Intermittent gout flare-ups which are managed by her primary care  Physician. This has not been a problem recently.   The Opioid medication pill count was appropriate. No reported significant side effects of Opioid medications noted. No  aberrant behavior noted. Patient cautioned regarding operation of machinery or vehicles. Patient understands risk and benefits of these medications. There is no indication or report of risk to self or others. Last UDS 08.07/2011 and was consistant.    Will refill meds, Percocet 5/325 not more than 5 per day.   Followup next month for refill of pain medication, pill count and drug monitoring.

## 2011-12-28 NOTE — Patient Instructions (Addendum)
Take your pain medications as prescribed.  ! would you to be walking several times a day with your walker to help maintain your strength, endurance and flexibility.

## 2012-01-11 ENCOUNTER — Ambulatory Visit: Payer: Medicare Other | Admitting: Physical Medicine and Rehabilitation

## 2012-02-01 ENCOUNTER — Encounter: Payer: Medicare Other | Attending: Neurosurgery | Admitting: Physical Medicine and Rehabilitation

## 2012-02-01 ENCOUNTER — Encounter: Payer: Self-pay | Admitting: Physical Medicine and Rehabilitation

## 2012-02-01 VITALS — BP 169/82 | HR 80 | Resp 14 | Ht <= 58 in | Wt 119.0 lb

## 2012-02-01 DIAGNOSIS — M1711 Unilateral primary osteoarthritis, right knee: Secondary | ICD-10-CM

## 2012-02-01 DIAGNOSIS — M47812 Spondylosis without myelopathy or radiculopathy, cervical region: Secondary | ICD-10-CM

## 2012-02-01 DIAGNOSIS — M1990 Unspecified osteoarthritis, unspecified site: Secondary | ICD-10-CM

## 2012-02-01 DIAGNOSIS — M48062 Spinal stenosis, lumbar region with neurogenic claudication: Secondary | ICD-10-CM

## 2012-02-01 DIAGNOSIS — Z760 Encounter for issue of repeat prescription: Secondary | ICD-10-CM | POA: Insufficient documentation

## 2012-02-01 DIAGNOSIS — M129 Arthropathy, unspecified: Secondary | ICD-10-CM

## 2012-02-01 DIAGNOSIS — M1710 Unilateral primary osteoarthritis, unspecified knee: Secondary | ICD-10-CM | POA: Insufficient documentation

## 2012-02-01 DIAGNOSIS — R269 Unspecified abnormalities of gait and mobility: Secondary | ICD-10-CM | POA: Insufficient documentation

## 2012-02-01 DIAGNOSIS — IMO0002 Reserved for concepts with insufficient information to code with codable children: Secondary | ICD-10-CM | POA: Insufficient documentation

## 2012-02-01 DIAGNOSIS — M47816 Spondylosis without myelopathy or radiculopathy, lumbar region: Secondary | ICD-10-CM

## 2012-02-01 DIAGNOSIS — M47817 Spondylosis without myelopathy or radiculopathy, lumbosacral region: Secondary | ICD-10-CM

## 2012-02-01 DIAGNOSIS — M19019 Primary osteoarthritis, unspecified shoulder: Secondary | ICD-10-CM

## 2012-02-01 DIAGNOSIS — M48061 Spinal stenosis, lumbar region without neurogenic claudication: Secondary | ICD-10-CM | POA: Insufficient documentation

## 2012-02-01 MED ORDER — OXYCODONE-ACETAMINOPHEN 5-325 MG PO TABS: 1.0000 | ORAL_TABLET | ORAL | Status: DC | PRN

## 2012-02-01 NOTE — Progress Notes (Signed)
Subjective:    Patient ID: [name], female    DOB: 10/18/1917, 78 y.o.   MRN: [id number]  HPI   The patient is a 78 year old woman who lives with her daughter. She is seen here for chronic pain which is related to severe osteoarthritis of her neck, low back, shoulders, bilateral knees. She has a history of the left knee replacement. She has known lumbar spondyosis and spinal stenosis. She also has neurogenic claudication which limits her ambulation capacity.  Her daughter accompanies her and is in the room with her permission.  She is here today for a refill of her Percocet 5/325 and drug monitoring.      Pain Inventory Average Pain 6 Pain Right Now 6 My pain is sharp, burning, dull, stabbing, tingling and aching  In the last 24 hours, has pain interfered with the following? General activity 10 Relation with others 10 Enjoyment of life 10 What TIME of day is your pain at its worst? all the time Sleep (in general) Fair  Pain is worse with: walking, bending, sitting, inactivity and standing Pain improves with: rest, heat/ice, medication and TENS Relief from Meds: 6  Mobility how many minutes can you walk? 5 ability to climb steps?  yes do you drive?  no Do you have any goals in this area?  no  Function retired Do you have any goals in this area?  no  Neuro/Psych No problems in this area  Prior Studies Any changes since last visit?  no  Physicians involved in your care Any changes since last visit?  no   Family History  Problem Relation Age of Onset  . Diabetes Sister   . Heart disease Sister    History   Social History  . Marital Status: Widowed    Spouse Name: N/A    Number of Children: N/A  . Years of Education: N/A   Social History Main Topics  . Smoking status: Never Smoker   . Smokeless tobacco: None  . Alcohol Use: No  . Drug Use: No  . Sexually Active: None   Other Topics Concern  . None   Social History Narrative  . None   Past  Surgical History  Procedure Date  . Breast lumpectomy   . Knee surgery   . Breast lumpectomy    Past Medical History  Diagnosis Date  . Hypertension   . Atrial fibrillation    BP 169/82  Pulse 80  Resp 14  Ht 4\' 10"  (1.473 m)  Wt 119 lb (53.978 kg)  BMI 24.87 kg/m2  SpO2 97%     Review of Systems  Musculoskeletal: Positive for back pain and gait problem.  All other systems reviewed and are negative.       Objective:   Physical Exam The patient is an elderly woman who does not appear in any distress. The patient is oriented x3. She's alert and cooperative. She follows commands without difficulty and answers questions appropriately.   Cranial nerves are grossly intact with the exception of hearing. Coordination is grossly intact. Reflexes are diminished in upper and lower extremities without abnormal tone clonus or tremors.  Motor strength is generally good in both upper and lower extremities without obvious focal deficit.    Significant limitations in range of motion are noted in her neck, bilateral shoulders and low back.  She exits her chair slowly and carefully. Gait is mildly unstable and uses a  walker. She complains of knee pain transitioning from sit to  stand.         Assessment & Plan:  1. Cervical spondylosis, stable.( CT cervical spine 4/11: Advanced cervical spondylosis and kyphosis)   2. Severe shoulder osteoarthritis bilaterally, stable.   3. Right knee osteoarthritis and history of left knee replacement,  stable.   4. Lumbar spondylosis/spinal stenosis/neurogenic claudication.   5. Intermittent gout flare-ups which are managed by her primary care physician.No problems recently with this.   The Opioid medication pill count was appropriate. No reported significant side effects of Opioid medications noted. No aberrant behavior noted. Patient cautioned regarding operation of machinery or vehicles. Patient understands risk and benefits of these  medications. There is no indication or report of risk to self or others. Last UDS 08.07/2011 and was consistant.  Will refill meds, Percocet 5/325 not more than 5 per day.  Followup next month for refill of pain medication, pill count and drug monitoring.

## 2012-02-01 NOTE — Patient Instructions (Signed)
Please keep your pain medications locked up and in a secure location.  See you back in one month.

## 2012-03-09 ENCOUNTER — Encounter: Payer: Medicare Other | Attending: Neurosurgery | Admitting: Physical Medicine and Rehabilitation

## 2012-03-09 ENCOUNTER — Encounter: Payer: Self-pay | Admitting: Physical Medicine and Rehabilitation

## 2012-03-09 VITALS — BP 142/80 | Ht <= 58 in | Wt 119.0 lb

## 2012-03-09 DIAGNOSIS — IMO0002 Reserved for concepts with insufficient information to code with codable children: Secondary | ICD-10-CM | POA: Insufficient documentation

## 2012-03-09 DIAGNOSIS — M1710 Unilateral primary osteoarthritis, unspecified knee: Secondary | ICD-10-CM | POA: Insufficient documentation

## 2012-03-09 DIAGNOSIS — Z5181 Encounter for therapeutic drug level monitoring: Secondary | ICD-10-CM | POA: Insufficient documentation

## 2012-03-09 DIAGNOSIS — G8929 Other chronic pain: Secondary | ICD-10-CM | POA: Insufficient documentation

## 2012-03-09 DIAGNOSIS — M47817 Spondylosis without myelopathy or radiculopathy, lumbosacral region: Secondary | ICD-10-CM

## 2012-03-09 DIAGNOSIS — M2550 Pain in unspecified joint: Secondary | ICD-10-CM

## 2012-03-09 DIAGNOSIS — M1711 Unilateral primary osteoarthritis, right knee: Secondary | ICD-10-CM

## 2012-03-09 DIAGNOSIS — Z760 Encounter for issue of repeat prescription: Secondary | ICD-10-CM

## 2012-03-09 DIAGNOSIS — M48061 Spinal stenosis, lumbar region without neurogenic claudication: Secondary | ICD-10-CM | POA: Insufficient documentation

## 2012-03-09 DIAGNOSIS — R269 Unspecified abnormalities of gait and mobility: Secondary | ICD-10-CM | POA: Insufficient documentation

## 2012-03-09 DIAGNOSIS — M47816 Spondylosis without myelopathy or radiculopathy, lumbar region: Secondary | ICD-10-CM

## 2012-03-09 DIAGNOSIS — M19019 Primary osteoarthritis, unspecified shoulder: Secondary | ICD-10-CM

## 2012-03-09 MED ORDER — OXYCODONE-ACETAMINOPHEN 5-325 MG PO TABS: 1.0000 | ORAL_TABLET | ORAL | Status: DC | PRN

## 2012-03-09 NOTE — Progress Notes (Signed)
Subjective:    Patient ID: [name], female    DOB: 1918-02-24, 78 y.o.   MRN: [id number]  HPIThe patient is a 78 year old woman who lives with her daughter. She is seen here for chronic pain which is related to severe osteoarthritis of her neck, low back, shoulders, bilateral knees. She has a history of the left knee replacement. She has known lumbar spondyosis and spinal stenosis. She also has neurogenic claudication which limits her ambulation capacity.   Her daughter accompanies her and is in the room with her permission.  She has no new pain complaints today. She can use to report good pain relief with current medications.  She reports no problems with constipation or over sedation she has had no falls in the interim.   She is here today for a refill of her Percocet 5/325 and drug monitoring.      Pain Inventory Average Pain 7 Pain Right Now 8 My pain is sharp, burning, dull, stabbing, tingling and aching  In the last 24 hours, has pain interfered with the following? General activity 10 Relation with others 10 Enjoyment of life 10 What TIME of day is your pain at its worst? all the time Sleep (in general) Fair  Pain is worse with: walking, bending, sitting, inactivity and standing Pain improves with: rest, heat/ice and medication Relief from Meds: 7  Mobility walk with assistance use a Charnetta Wulff how many minutes can you walk? 5 ability to climb steps?  no do you drive?  no use a wheelchair needs help with transfers Do you have any goals in this area?  no  Function retired Do you have any goals in this area?  no  Neuro/Psych No problems in this area  Prior Studies Any changes since last visit?  no  Physicians involved in your care Any changes since last visit?  no   Family History  Problem Relation Age of Onset  . Diabetes Sister   . Heart disease Sister    History   Social History  . Marital Status: Widowed    Spouse Name: N/A    Number of  Children: N/A  . Years of Education: N/A   Social History Main Topics  . Smoking status: Never Smoker   . Smokeless tobacco: None  . Alcohol Use: No  . Drug Use: No  . Sexually Active: None   Other Topics Concern  . None   Social History Narrative  . None   Past Surgical History  Procedure Date  . Breast lumpectomy   . Knee surgery   . Breast lumpectomy    Past Medical History  Diagnosis Date  . Hypertension   . Atrial fibrillation    BP 142/80  Ht 4\' 10"  (1.473 m)  Wt 119 lb (53.978 kg)  BMI 24.87 kg/m2    Review of Systems  Musculoskeletal: Positive for back pain and gait problem.  All other systems reviewed and are negative.       Objective:   Physical Exam The patient is an elderly woman who does not appear in any distress. The patient is oriented x3. She's alert and cooperative. She follows commands without difficulty and answers questions appropriately.  Cranial nerves are grossly intact with the exception of hearing. Coordination is grossly intact. Reflexes are diminished in upper and lower extremities without abnormal tone clonus or tremors.    Motor strength is generally good in both upper and lower extremities without obvious focal deficit.    Significant limitations in  range of motion are noted in her neck, bilateral shoulders and low back.  She exits her chair slowly and carefully. Gait is mildly unstable and uses a Willem Klingensmith.       Assessment & Plan:  1. Cervical spondylosis, stable.( CT cervical spine 4/11: Advanced cervical spondylosis and kyphosis)  2. Severe shoulder osteoarthritis bilaterally, stable.  3. Right knee osteoarthritis and history of left knee replacement,  stable.  4. Lumbar spondylosis/spinal stenosis/neurogenic claudication.  5. Intermittent gout flare-ups which are managed by her primary care physician    The Opioid medication pill count was appropriate. No reported significant side effects of Opioid medications noted. No  aberrant behavior noted. Patient cautioned regarding operation of machinery or vehicles. Patient understands risk and benefits of these medications. There is no indication or report of risk to self or others.   Last UDS 08.07/2011 and was consistant.    Will refill meds, Percocet 5/325 not more than 5 per day.   Followup next month for refill of pain medication, pill count and drug monitoring.

## 2012-03-09 NOTE — Patient Instructions (Signed)
Please keep your pain medications locked up and in a secure location.  I will see you back in one month.  Please continue to use your walker in the house.

## 2012-04-04 ENCOUNTER — Encounter: Payer: Self-pay | Admitting: Physical Medicine and Rehabilitation

## 2012-04-04 ENCOUNTER — Encounter
Payer: Medicare Other | Attending: Physical Medicine and Rehabilitation | Admitting: Physical Medicine and Rehabilitation

## 2012-04-04 VITALS — BP 110/78 | HR 93 | Resp 14 | Ht 59.0 in | Wt 118.0 lb

## 2012-04-04 DIAGNOSIS — M47817 Spondylosis without myelopathy or radiculopathy, lumbosacral region: Secondary | ICD-10-CM | POA: Insufficient documentation

## 2012-04-04 DIAGNOSIS — Z8639 Personal history of other endocrine, nutritional and metabolic disease: Secondary | ICD-10-CM | POA: Insufficient documentation

## 2012-04-04 DIAGNOSIS — M1990 Unspecified osteoarthritis, unspecified site: Secondary | ICD-10-CM

## 2012-04-04 DIAGNOSIS — G8929 Other chronic pain: Secondary | ICD-10-CM

## 2012-04-04 DIAGNOSIS — M2550 Pain in unspecified joint: Secondary | ICD-10-CM

## 2012-04-04 DIAGNOSIS — Z5181 Encounter for therapeutic drug level monitoring: Secondary | ICD-10-CM

## 2012-04-04 DIAGNOSIS — Z96659 Presence of unspecified artificial knee joint: Secondary | ICD-10-CM | POA: Insufficient documentation

## 2012-04-04 DIAGNOSIS — I739 Peripheral vascular disease, unspecified: Secondary | ICD-10-CM | POA: Insufficient documentation

## 2012-04-04 DIAGNOSIS — M47812 Spondylosis without myelopathy or radiculopathy, cervical region: Secondary | ICD-10-CM | POA: Insufficient documentation

## 2012-04-04 DIAGNOSIS — M1710 Unilateral primary osteoarthritis, unspecified knee: Secondary | ICD-10-CM | POA: Insufficient documentation

## 2012-04-04 DIAGNOSIS — M19019 Primary osteoarthritis, unspecified shoulder: Secondary | ICD-10-CM | POA: Insufficient documentation

## 2012-04-04 DIAGNOSIS — Z862 Personal history of diseases of the blood and blood-forming organs and certain disorders involving the immune mechanism: Secondary | ICD-10-CM | POA: Insufficient documentation

## 2012-04-04 MED ORDER — OXYCODONE-ACETAMINOPHEN 5-325 MG PO TABS: 1.0000 | ORAL_TABLET | ORAL | Status: DC | PRN

## 2012-04-04 NOTE — Addendum Note (Signed)
Addended by: Claiborne Rigg D on: 04/04/2012 10:30 AM   Modules accepted: Orders

## 2012-04-04 NOTE — Progress Notes (Signed)
Subjective:    Patient ID: [name], female    DOB: 05/04/1917, 79 y.o.   MRN: [id number]  HPI The patient is a 79 year old woman who lives independently. She is seen here for chronic pain which is related to severe osteoarthritis of her neck, low back, shoulders, bilateral knees. She has a history of the left knee replacement. She has known lumbar spondyosis and spinal stenosis. She also has neurogenic claudication which limits her ambulation capacity. The problem has been stable, she did not have any new falls since her last visit.  Pain Inventory Average Pain 8 Pain Right Now 8 My pain is sharp, burning, dull, stabbing, tingling and aching  In the last 24 hours, has pain interfered with the following? General activity 10 Relation with others 10 Enjoyment of life 10 What TIME of day is your pain at its worst? all the time Sleep (in general) Fair  Pain is worse with: walking, bending, sitting, inactivity and standing Pain improves with: rest, heat/ice, medication and TENS Relief from Meds: 8  Mobility walk with assistance use a walker how many minutes can you walk? 4-5 ability to climb steps?  no do you drive?  no Do you have any goals in this area?  no  Function retired Do you have any goals in this area?  no  Neuro/Psych bladder control problems bowel control problems trouble walking  Prior Studies Any changes since last visit?  no  Physicians involved in your care Any changes since last visit?  no   Family History  Problem Relation Age of Onset  . Diabetes Sister   . Heart disease Sister    History   Social History  . Marital Status: Widowed    Spouse Name: N/A    Number of Children: N/A  . Years of Education: N/A   Social History Main Topics  . Smoking status: Never Smoker   . Smokeless tobacco: None  . Alcohol Use: No  . Drug Use: No  . Sexually Active: None   Other Topics Concern  . None   Social History Narrative  . None   Past  Surgical History  Procedure Date  . Breast lumpectomy   . Knee surgery   . Breast lumpectomy    Past Medical History  Diagnosis Date  . Hypertension   . Atrial fibrillation    BP 110/78  Pulse 93  Resp 14  Ht 4\' 11"  (1.499 m)  Wt 118 lb (53.524 kg)  BMI 23.83 kg/m2  SpO2 99%     Review of Systems  HENT: Positive for neck pain.   Musculoskeletal: Positive for myalgias and arthralgias.  All other systems reviewed and are negative.       Objective:   Physical Exam Constitutional: She appears well-developed.  HENT:  Head: Normocephalic.  Skin: Skin is warm and dry.  Psychiatric: She has a normal mood and affect.  Symmetric normal motor tone is noted throughout. General decrease in muscle bulk. Muscle testing reveals 5/5 muscle strength of the upper extremity, and 5/5 of the lower extremity.Restriction of motion in upper and lower extremities. ROM of cervical spine is restricted. ROM of left knee restricted extension by 10 degrees.  DTR in the upper extremity are present and symmetric 2+, lower extremities could not elicit, but symmetrical. No clonus is noted.  Patient arises from chair with difficulty. She is walking with a walker now, which gives her more stability. Walking on heels and toes and Tandem walk not tested due  to safety .         Assessment & Plan:  1. Cervical spondylosis, stable.( CT cervical spine 4/11: Advanced cervical spondylosis and kyphosis)  2. Severe shoulder osteoarthritis bilaterally, stable.  3. Right knee osteoarthritis and history of left knee replacement,  stable.  4. Lumbar spondylosis/spinal stenosis/neurogenic claudication.  5. Intermittent gout flare-ups which are managed by her primary care  Physician. This has not been a problem recently.  6. Advised patient to continue her home exercise program in a sitting position.  The Opioid medication pill count was appropriate. No reported significant side effects of Opioid medications noted.  No aberrant behavior noted. Patient cautioned regarding operation of machinery or vehicles. Patient understands risk and benefits of these medications. There is no indication or report of risk to self or others.  Will refill meds, Percocet 5/325 not more than 5 per day.

## 2012-04-04 NOTE — Patient Instructions (Signed)
Stay as active as tolerated and as it is safe. You can use Arnica cream for your arthritic joint pain.

## 2012-05-02 ENCOUNTER — Encounter: Payer: Medicare Other | Admitting: Physical Medicine and Rehabilitation

## 2012-05-09 ENCOUNTER — Encounter: Payer: Self-pay | Admitting: Physical Medicine and Rehabilitation

## 2012-05-09 ENCOUNTER — Encounter
Payer: Medicare Other | Attending: Physical Medicine and Rehabilitation | Admitting: Physical Medicine and Rehabilitation

## 2012-05-09 VITALS — BP 110/70 | HR 80 | Resp 14 | Ht 59.0 in | Wt 115.0 lb

## 2012-05-09 DIAGNOSIS — Z8639 Personal history of other endocrine, nutritional and metabolic disease: Secondary | ICD-10-CM | POA: Insufficient documentation

## 2012-05-09 DIAGNOSIS — M1710 Unilateral primary osteoarthritis, unspecified knee: Secondary | ICD-10-CM | POA: Insufficient documentation

## 2012-05-09 DIAGNOSIS — G8929 Other chronic pain: Secondary | ICD-10-CM

## 2012-05-09 DIAGNOSIS — Z862 Personal history of diseases of the blood and blood-forming organs and certain disorders involving the immune mechanism: Secondary | ICD-10-CM | POA: Insufficient documentation

## 2012-05-09 DIAGNOSIS — M2550 Pain in unspecified joint: Secondary | ICD-10-CM

## 2012-05-09 DIAGNOSIS — M48062 Spinal stenosis, lumbar region with neurogenic claudication: Secondary | ICD-10-CM | POA: Insufficient documentation

## 2012-05-09 DIAGNOSIS — M129 Arthropathy, unspecified: Secondary | ICD-10-CM

## 2012-05-09 DIAGNOSIS — M19019 Primary osteoarthritis, unspecified shoulder: Secondary | ICD-10-CM | POA: Insufficient documentation

## 2012-05-09 DIAGNOSIS — M47812 Spondylosis without myelopathy or radiculopathy, cervical region: Secondary | ICD-10-CM | POA: Insufficient documentation

## 2012-05-09 DIAGNOSIS — M47817 Spondylosis without myelopathy or radiculopathy, lumbosacral region: Secondary | ICD-10-CM | POA: Insufficient documentation

## 2012-05-09 DIAGNOSIS — Z96659 Presence of unspecified artificial knee joint: Secondary | ICD-10-CM | POA: Insufficient documentation

## 2012-05-09 DIAGNOSIS — M1990 Unspecified osteoarthritis, unspecified site: Secondary | ICD-10-CM

## 2012-05-09 MED ORDER — OXYCODONE-ACETAMINOPHEN 5-325 MG PO TABS: 1.0000 | ORAL_TABLET | ORAL | Status: DC | PRN

## 2012-05-09 NOTE — Progress Notes (Signed)
Subjective:    Patient ID: [name], female    DOB: 1917-06-17, 79 y.o.   MRN: [id number]  HPI The patient is a 79 year old woman who lives independently. She is seen here for chronic pain which is related to severe osteoarthritis of her neck, low back, shoulders, bilateral knees. She has a history of the left knee replacement. She has known lumbar spondyosis and spinal stenosis. She also has neurogenic claudication which limits her ambulation capacity. The problem has been stable, she did not have any new falls since her last visit.  Pain Inventory Average Pain 8 Pain Right Now 9 My pain is sharp, burning, dull, stabbing, tingling and aching  In the last 24 hours, has pain interfered with the following? General activity 9 Relation with others 9 Enjoyment of life 9 What TIME of day is your pain at its worst? all the time Sleep (in general) Fair  Pain is worse with: walking, bending, sitting, inactivity and standing Pain improves with: rest, heat/ice and medication Relief from Meds: 8  Mobility walk with assistance use a walker how many minutes can you walk? 5 ability to climb steps?  no do you drive?  no use a wheelchair needs help with transfers Do you have any goals in this area?  no  Function retired  Neuro/Psych No problems in this area  Prior Studies Any changes since last visit?  no  Physicians involved in your care Any changes since last visit?  no   Family History  Problem Relation Age of Onset  . Diabetes Sister   . Heart disease Sister    History   Social History  . Marital Status: Widowed    Spouse Name: N/A    Number of Children: N/A  . Years of Education: N/A   Social History Main Topics  . Smoking status: Never Smoker   . Smokeless tobacco: None  . Alcohol Use: No  . Drug Use: No  . Sexually Active: None   Other Topics Concern  . None   Social History Narrative  . None   Past Surgical History  Procedure Laterality Date  .  Breast lumpectomy    . Knee surgery    . Breast lumpectomy     Past Medical History  Diagnosis Date  . Hypertension   . Atrial fibrillation    BP 110/70  Pulse 80  Resp 14  Ht 4\' 11"  (1.499 m)  Wt 115 lb (52.164 kg)  BMI 23.21 kg/m2  SpO2 94%     Review of Systems  All other systems reviewed and are negative.       Objective:   Physical Exam Constitutional: She appears well-developed.  HENT:  Head: Normocephalic.  Skin: Skin is warm and dry.  Psychiatric: She has a normal mood and affect.  Symmetric normal motor tone is noted throughout. General decrease in muscle bulk. Muscle testing reveals 5/5 muscle strength of the upper extremity, and 5/5 of the lower extremity.Restriction of motion in upper and lower extremities. ROM of cervical spine is restricted. ROM of left knee restricted extension by 10 degrees.  DTR in the upper extremity are present and symmetric 2+, lower extremities could not elicit, but symmetrical. No clonus is noted.  Patient arises from chair with difficulty. She is walking with a walker now, which gives her more stability. Walking on heels and toes and Tandem walk not tested due to safety .         Assessment & Plan:  1. Cervical  spondylosis, stable.( CT cervical spine 4/11: Advanced cervical spondylosis and kyphosis)  2. Severe shoulder osteoarthritis bilaterally, stable.  3. Right knee osteoarthritis and history of left knee replacement,  stable.  4. Lumbar spondylosis/spinal stenosis/neurogenic claudication.  5. Intermittent gout flare-ups which are managed by her primary care  Physician. This has not been a problem recently.  6. Advised patient to continue her home exercise program in a sitting position. Advised patient to walk with her walker, but only if her daughter or somebody is helping her. The Opioid medication pill count was appropriate. No reported significant side effects of Opioid medications noted. No aberrant behavior noted.  Patient cautioned regarding operation of machinery or vehicles. Patient understands risk and benefits of these medications. There is no indication or report of risk to self or others.  Will refill meds, Percocet 5/325 not more than 5 per day.

## 2012-05-09 NOTE — Patient Instructions (Signed)
Try to do some exercises in a sitting position, try to walk with your walker, but only when your daughter is with you.

## 2012-05-10 ENCOUNTER — Telehealth: Payer: Self-pay

## 2012-05-10 NOTE — Telephone Encounter (Signed)
Patients daughter called and wanted to know if Clydie Braun noticed any memory issues with her mother.

## 2012-05-11 NOTE — Telephone Encounter (Signed)
I did not notice a big difference in her cognitive ability, but her daughter who lives with her, might be able to notice small changes in her memory earlier. If the daughter is concerned, I can refer her to a neurologist for an evaluation of her memory.

## 2012-05-16 NOTE — Telephone Encounter (Signed)
Patients daughter just wanted [place] to be aware.

## 2012-06-10 ENCOUNTER — Telehealth: Payer: Self-pay | Admitting: Physical Medicine and Rehabilitation

## 2012-06-10 MED ORDER — OXYCODONE-ACETAMINOPHEN 5-325 MG PO TABS: 1.0000 | ORAL_TABLET | ORAL | Status: DC | PRN

## 2012-06-10 NOTE — Telephone Encounter (Signed)
[name], daughter of [name] would like to cancel appointment on Monday due to weather.  Patient is 79 years old and daughter was wondering if patient could be seen the following month, but could Rhonda Bible pick up [name]'s prescription today.

## 2012-06-10 NOTE — Telephone Encounter (Signed)
Pat patients daughter informed script is ready for pick up

## 2012-06-13 ENCOUNTER — Encounter: Payer: Medicare Other | Admitting: Physical Medicine and Rehabilitation

## 2012-07-11 ENCOUNTER — Encounter: Payer: Self-pay | Admitting: Physical Medicine and Rehabilitation

## 2012-07-11 ENCOUNTER — Encounter
Payer: Medicare Other | Attending: Physical Medicine and Rehabilitation | Admitting: Physical Medicine and Rehabilitation

## 2012-07-11 VITALS — BP 160/90 | HR 71 | Resp 16 | Ht 59.0 in | Wt 115.0 lb

## 2012-07-11 DIAGNOSIS — M19019 Primary osteoarthritis, unspecified shoulder: Secondary | ICD-10-CM | POA: Insufficient documentation

## 2012-07-11 DIAGNOSIS — M1710 Unilateral primary osteoarthritis, unspecified knee: Secondary | ICD-10-CM | POA: Insufficient documentation

## 2012-07-11 DIAGNOSIS — Z8639 Personal history of other endocrine, nutritional and metabolic disease: Secondary | ICD-10-CM | POA: Insufficient documentation

## 2012-07-11 DIAGNOSIS — Z96659 Presence of unspecified artificial knee joint: Secondary | ICD-10-CM | POA: Insufficient documentation

## 2012-07-11 DIAGNOSIS — G8929 Other chronic pain: Secondary | ICD-10-CM | POA: Insufficient documentation

## 2012-07-11 DIAGNOSIS — M47812 Spondylosis without myelopathy or radiculopathy, cervical region: Secondary | ICD-10-CM

## 2012-07-11 DIAGNOSIS — M40299 Other kyphosis, site unspecified: Secondary | ICD-10-CM | POA: Insufficient documentation

## 2012-07-11 DIAGNOSIS — M47817 Spondylosis without myelopathy or radiculopathy, lumbosacral region: Secondary | ICD-10-CM

## 2012-07-11 DIAGNOSIS — Z862 Personal history of diseases of the blood and blood-forming organs and certain disorders involving the immune mechanism: Secondary | ICD-10-CM | POA: Insufficient documentation

## 2012-07-11 MED ORDER — OXYCODONE-ACETAMINOPHEN 5-325 MG PO TABS: 1.0000 | ORAL_TABLET | ORAL | Status: DC | PRN

## 2012-07-11 NOTE — Patient Instructions (Signed)
Try to stay as active as tolerated 

## 2012-07-11 NOTE — Progress Notes (Signed)
Subjective:    Patient ID: [name], female    DOB: September 19, 1917, 79 y.o.   MRN: [id number]  HPI The patient is a 79 year old woman who lived independently, since last visit her daughter moved in with her. She is seen here for chronic pain which is related to severe osteoarthritis of her neck, low back, shoulders, bilateral knees. She has a history of the left knee replacement. She has known lumbar spondyosis and spinal stenosis. She also has neurogenic claudication which limits her ambulation capacity. The problem has been stable, she did not have any new falls since her last visit.  Pain Inventory Average Pain 7 Pain Right Now 8 My pain is sharp, burning, dull, stabbing, tingling and aching  In the last 24 hours, has pain interfered with the following? General activity 10 Relation with others 10 Enjoyment of life 10 What TIME of day is your pain at its worst? all the time Sleep (in general) Fair  Pain is worse with: walking, bending, sitting, inactivity, standing, unsure and some activites Pain improves with: rest, heat/ice and medication Relief from Meds: 7  Mobility walk with assistance use a cane use a walker how many minutes can you walk? 4-5 ability to climb steps?  no do you drive?  no use a wheelchair needs help with transfers Do you have any goals in this area?  no  Function Do you have any goals in this area?  no  Neuro/Psych No problems in this area  Prior Studies Any changes since last visit?  no  Physicians involved in your care Any changes since last visit?  no   Family History  Problem Relation Age of Onset  . Diabetes Sister   . Heart disease Sister    History   Social History  . Marital Status: Widowed    Spouse Name: N/A    Number of Children: N/A  . Years of Education: N/A   Social History Main Topics  . Smoking status: Never Smoker   . Smokeless tobacco: None  . Alcohol Use: No  . Drug Use: No  . Sexually Active: None   Other  Topics Concern  . None   Social History Narrative  . None   Past Surgical History  Procedure Laterality Date  . Breast lumpectomy    . Knee surgery    . Breast lumpectomy     Past Medical History  Diagnosis Date  . Hypertension   . Atrial fibrillation    BP 160/90  Pulse 71  Resp 16  Ht 4\' 11"  (1.499 m)  Wt 115 lb (52.164 kg)  BMI 23.21 kg/m2  SpO2 96%     Review of Systems  HENT: Positive for neck pain.   Musculoskeletal: Positive for back pain.  All other systems reviewed and are negative.       Objective:   Physical Exam Constitutional: She appears well-developed.  HENT:  Head: Normocephalic.  Skin: Skin is warm and dry.  Psychiatric: She has a normal mood and affect.  Symmetric normal motor tone is noted throughout. General decrease in muscle bulk. Muscle testing reveals 5/5 muscle strength of the upper extremity, and 5/5 of the lower extremity.Restriction of motion in upper and lower extremities. ROM of cervical spine is restricted. ROM of left knee restricted extension by 10 degrees.  DTR in the upper extremity are present and symmetric 2+, lower extremities could not elicit, but symmetrical. No clonus is noted.  Patient arises from chair with difficulty. She is walking  with a walker now, which gives her more stability. Walking on heels and toes and Tandem walk not tested due to safety .         Assessment & Plan:  1. Cervical spondylosis, stable.( CT cervical spine 4/11: Advanced cervical spondylosis and kyphosis)  2. Severe shoulder osteoarthritis bilaterally, stable.  3. Right knee osteoarthritis and history of left knee replacement,  stable.  4. Lumbar spondylosis/spinal stenosis/neurogenic claudication.  5. Intermittent gout flare-ups which are managed by her primary care  Physician. This has not been a problem recently.  6. Advised patient to continue her home exercise program in a sitting position. Advised patient to walk with her walker, but  only if her daughter or somebody is helping her.  The Opioid medication pill count was appropriate. No reported significant side effects of Opioid medications noted. No aberrant behavior noted. Patient cautioned regarding operation of machinery or vehicles. Patient understands risk and benefits of these medications. There is no indication or report of risk to self or others.  Will refill meds, Percocet 5/325 not more than 5 per day.

## 2012-07-12 ENCOUNTER — Ambulatory Visit: Payer: Medicare Other | Admitting: Physical Medicine and Rehabilitation

## 2012-08-05 ENCOUNTER — Telehealth: Payer: Self-pay

## 2012-08-05 NOTE — Telephone Encounter (Signed)
Patient is unable to keep appointment and her daughter request percocet refill.  Last fill was 07/12/12.

## 2012-08-08 ENCOUNTER — Encounter: Payer: Medicare Other | Admitting: Physical Medicine and Rehabilitation

## 2012-08-08 MED ORDER — OXYCODONE-ACETAMINOPHEN 5-325 MG PO TABS: 1.0000 | ORAL_TABLET | ORAL | Status: DC | PRN

## 2012-08-08 NOTE — Telephone Encounter (Signed)
That is ok, the patient is 89, and sometimes it is too much for her to leave the house

## 2012-08-08 NOTE — Telephone Encounter (Signed)
Patient daughter aware percocet refill is ready.

## 2012-09-05 ENCOUNTER — Telehealth: Payer: Self-pay | Admitting: *Deleted

## 2012-09-05 NOTE — Telephone Encounter (Signed)
Ms [name] would like a call from [place].  She is wanting to discuss the possibility of picking up [name]'s rx again this month.  She isweak and has not been out of the house since the last appointment, and she does not know if she can get out of the house right now.  She is able to get her up and weigh her daily but that is about it.

## 2012-09-05 NOTE — Telephone Encounter (Signed)
It is ok for one more time, but next month we would have to see her

## 2012-09-06 MED ORDER — OXYCODONE-ACETAMINOPHEN 5-325 MG PO TABS: 1.0000 | ORAL_TABLET | ORAL | Status: DC | PRN

## 2012-09-06 NOTE — Telephone Encounter (Signed)
Rx printed for [name] to sign. Notified Ms Linna Darner

## 2012-10-12 ENCOUNTER — Encounter: Payer: Self-pay | Admitting: Physical Medicine and Rehabilitation

## 2012-10-12 ENCOUNTER — Encounter
Payer: Medicare Other | Attending: Physical Medicine and Rehabilitation | Admitting: Physical Medicine and Rehabilitation

## 2012-10-12 VITALS — BP 140/90 | HR 81 | Resp 16 | Ht 59.0 in | Wt 117.0 lb

## 2012-10-12 DIAGNOSIS — G8929 Other chronic pain: Secondary | ICD-10-CM | POA: Insufficient documentation

## 2012-10-12 DIAGNOSIS — M47812 Spondylosis without myelopathy or radiculopathy, cervical region: Secondary | ICD-10-CM | POA: Insufficient documentation

## 2012-10-12 DIAGNOSIS — M109 Gout, unspecified: Secondary | ICD-10-CM | POA: Insufficient documentation

## 2012-10-12 DIAGNOSIS — Z79899 Other long term (current) drug therapy: Secondary | ICD-10-CM

## 2012-10-12 DIAGNOSIS — M47817 Spondylosis without myelopathy or radiculopathy, lumbosacral region: Secondary | ICD-10-CM

## 2012-10-12 DIAGNOSIS — Z96659 Presence of unspecified artificial knee joint: Secondary | ICD-10-CM | POA: Insufficient documentation

## 2012-10-12 DIAGNOSIS — I10 Essential (primary) hypertension: Secondary | ICD-10-CM | POA: Insufficient documentation

## 2012-10-12 DIAGNOSIS — M159 Polyosteoarthritis, unspecified: Secondary | ICD-10-CM | POA: Insufficient documentation

## 2012-10-12 DIAGNOSIS — Z5181 Encounter for therapeutic drug level monitoring: Secondary | ICD-10-CM

## 2012-10-12 MED ORDER — OXYCODONE-ACETAMINOPHEN 5-325 MG PO TABS: 1.0000 | ORAL_TABLET | ORAL | Status: DC | PRN

## 2012-10-12 NOTE — Progress Notes (Signed)
Subjective:    Patient ID: [name], female    DOB: [birth year], 79 y.o.   MRN: [id number]  HPI The patient is a 79 year old woman who lived independently, since last visit her daughter moved in with her. She is seen here for chronic pain which is related to severe osteoarthritis of her neck, low back, shoulders, bilateral knees. She has a history of the left knee replacement. She has known lumbar spondyosis and spinal stenosis. She also has neurogenic claudication which limits her ambulation capacity. The problem has been stable, she did not have any new falls since her last visit.  Pain Inventory Average Pain 8 Pain Right Now 8 My pain is sharp, burning, dull, stabbing and aching  In the last 24 hours, has pain interfered with the following? General activity 6 Relation with others 6 Enjoyment of life 6 What TIME of day is your pain at its worst? constant Sleep (in general) Fair  Pain is worse with: walking, bending, sitting, inactivity and standing Pain improves with: rest Relief from Meds: 7  Mobility use a walker how many minutes can you walk? 4-5 ability to climb steps?  no do you drive?  no use a wheelchair needs help with transfers Do you have any goals in this area?  no  Function I need assistance with the following:  meal prep, household duties and shopping  Neuro/Psych trouble walking  Prior Studies Any changes since last visit?  no  Physicians involved in your care Any changes since last visit?  no   Family History  Problem Relation Age of Onset  . Diabetes Sister   . Heart disease Sister    History   Social History  . Marital Status: Widowed    Spouse Name: N/A    Number of Children: N/A  . Years of Education: N/A   Social History Main Topics  . Smoking status: Never Smoker   . Smokeless tobacco: None  . Alcohol Use: No  . Drug Use: No  . Sexually Active: None   Other Topics Concern  . None   Social History Narrative  . None    Past Surgical History  Procedure Laterality Date  . Breast lumpectomy    . Knee surgery    . Breast lumpectomy     Past Medical History  Diagnosis Date  . Hypertension   . Atrial fibrillation    BP 140/90  Pulse 81  Resp 16  Ht 4\' 11"  (1.499 m)  Wt 117 lb (53.071 kg)  BMI 23.62 kg/m2  SpO2 97%     Review of Systems  Musculoskeletal: Positive for back pain and gait problem.  All other systems reviewed and are negative.       Objective:   Physical Exam Constitutional: She appears well-developed.  HENT:  Head: Normocephalic.  Skin: Skin is warm and dry.  Psychiatric: She has a normal mood and affect.  Symmetric normal motor tone is noted throughout. General decrease in muscle bulk. Muscle testing reveals 5/5 muscle strength of the upper extremity, and 5/5 of the lower extremity.Restriction of motion in upper and lower extremities. ROM of cervical spine is restricted. ROM of left knee restricted extension by 10 degrees.  DTR in the upper extremity are present and symmetric 2+, lower extremities could not elicit, but symmetrical. No clonus is noted.  Patient arises from chair with difficulty. She is walking with a walker now, which gives her more stability. Walking on heels and toes and Tandem walk not tested  due to safety .         Assessment & Plan:  1. Cervical spondylosis, stable.( CT cervical spine 4/11: Advanced cervical spondylosis and kyphosis)  2. Severe shoulder osteoarthritis bilaterally, stable.  3. Right knee osteoarthritis and history of left knee replacement,  stable.  4. Lumbar spondylosis/spinal stenosis/neurogenic claudication.  5. Intermittent gout flare-ups which are managed by her primary care  Physician. This has not been a problem recently.  6. Advised patient to continue her home exercise program in a sitting position. Advised patient to walk with her walker, but only if her daughter or somebody is helping her.  The Opioid medication pill  count was appropriate. No reported significant side effects of Opioid medications noted. No aberrant behavior noted. Patient cautioned regarding operation of machinery or vehicles. Patient understands risk and benefits of these medications. There is no indication or report of risk to self or others.  Will refill meds, Percocet 5/325 not more than 5 per day. Patient has a heart condition, insufficient valves, per patient, she is very easily fatigued, getting to the office is putting a strain on her. Her pill count was always ok, no aberrant behavior, or signs of abuse of her narcotics. I gave her a second prescription, and will see her back in 2 month.

## 2012-10-12 NOTE — Patient Instructions (Signed)
Rest and enjoy your day

## 2012-12-05 ENCOUNTER — Telehealth: Payer: Self-pay

## 2012-12-05 NOTE — Telephone Encounter (Signed)
Patients daughter says that the patient is not having the blockage from the medication.  She did see her PCP Dr Doristine Counter regarding this.  Patients daughter says the patient is to weak to make the trip.  She would like [place] to reconsider not seeing the patient.

## 2012-12-05 NOTE — Telephone Encounter (Signed)
We have not seen her in almost 2 month, I understand that it is difficult for the patient to come, but if she has troubles with constipation we might have to reduce her narcotics, she should try to come in

## 2012-12-05 NOTE — Telephone Encounter (Signed)
If she is too weak to come, we might have to change her to a different pain medication, like tramadol, where she does not have to come in that often, also if she is very weak the oxycodone might be too strong for her, and might increase her fall risk.  If she decides to change the pain medication, we will wean her down off the oxycodone

## 2012-12-05 NOTE — Telephone Encounter (Signed)
Patients daughter called requesting percocet refill.  Patient is having trouble with colon blockage and is very weak.  She was hoping to skip this next appointment.  Last filled was 08/15, patients daughter says she has enough to get her through until 09/18.  Please advise.

## 2012-12-06 NOTE — Telephone Encounter (Signed)
Patients daughter says they have tried the tramadol and it does not work for the patient.  Also tried to explain the risks of the oxycodone but patient daughter refuses tramadol.  Patients daughter says she is going to stop her miralax and bring her into her appointment.

## 2012-12-06 NOTE — Telephone Encounter (Signed)
Ok I will discuss this problem with the patient at her visit

## 2012-12-12 ENCOUNTER — Encounter: Payer: Medicare Other | Admitting: Physical Medicine and Rehabilitation

## 2012-12-19 ENCOUNTER — Encounter
Payer: Medicare Other | Attending: Physical Medicine and Rehabilitation | Admitting: Physical Medicine and Rehabilitation

## 2012-12-19 ENCOUNTER — Encounter: Payer: Self-pay | Admitting: Physical Medicine and Rehabilitation

## 2012-12-19 VITALS — BP 150/68 | HR 79 | Resp 16 | Ht 59.0 in | Wt 117.0 lb

## 2012-12-19 DIAGNOSIS — M159 Polyosteoarthritis, unspecified: Secondary | ICD-10-CM | POA: Insufficient documentation

## 2012-12-19 DIAGNOSIS — M109 Gout, unspecified: Secondary | ICD-10-CM | POA: Insufficient documentation

## 2012-12-19 DIAGNOSIS — I10 Essential (primary) hypertension: Secondary | ICD-10-CM | POA: Insufficient documentation

## 2012-12-19 DIAGNOSIS — G8929 Other chronic pain: Secondary | ICD-10-CM | POA: Insufficient documentation

## 2012-12-19 DIAGNOSIS — M47817 Spondylosis without myelopathy or radiculopathy, lumbosacral region: Secondary | ICD-10-CM | POA: Insufficient documentation

## 2012-12-19 DIAGNOSIS — Z79899 Other long term (current) drug therapy: Secondary | ICD-10-CM | POA: Insufficient documentation

## 2012-12-19 DIAGNOSIS — M1990 Unspecified osteoarthritis, unspecified site: Secondary | ICD-10-CM

## 2012-12-19 DIAGNOSIS — Z96659 Presence of unspecified artificial knee joint: Secondary | ICD-10-CM | POA: Insufficient documentation

## 2012-12-19 DIAGNOSIS — M47812 Spondylosis without myelopathy or radiculopathy, cervical region: Secondary | ICD-10-CM | POA: Insufficient documentation

## 2012-12-19 MED ORDER — OXYCODONE-ACETAMINOPHEN 5-325 MG PO TABS: 1.0000 | ORAL_TABLET | ORAL | Status: DC | PRN

## 2012-12-19 NOTE — Progress Notes (Signed)
Subjective:    Patient ID: [name], female    DOB: 1918/01/14, 79 y.o.   MRN: [id number]  HPI The patient is a 79 year old woman who lived independently, since last visit her daughter moved in with her. She is seen here for chronic pain which is related to severe osteoarthritis of her neck, low back, shoulders, bilateral knees. She has a history of the left knee replacement. She has known lumbar spondyosis and spinal stenosis. She also has neurogenic claudication which limits her ambulation capacity. The problem has been stable, she did not have any new falls since her last visit. The patient's daughter reports that the patient had an episode of constipation last month, because the patient would not drink like she should have.  Pain Inventory Average Pain 7 Pain Right Now 8 My pain is sharp, burning, dull, stabbing and aching  In the last 24 hours, has pain interfered with the following? General activity 10 Relation with others 10 Enjoyment of life 10 What TIME of day is your pain at its worst? all the time Sleep (in general) Fair  Pain is worse with: walking, bending, sitting, inactivity and standing Pain improves with: rest, heat/ice and medication Relief from Meds: 7  Mobility walk with assistance use a walker how many minutes can you walk? 5 ability to climb steps?  no do you drive?  no Do you have any goals in this area?  no  Function I need assistance with the following:  dressing, bathing, toileting, meal prep, household duties and shopping Do you have any goals in this area?  no  Neuro/Psych No problems in this area  Prior Studies Any changes since last visit?  yes  Physicians involved in your care Any changes since last visit?  no   Family History  Problem Relation Age of Onset  . Diabetes Sister   . Heart disease Sister    History   Social History  . Marital Status: Widowed    Spouse Name: N/A    Number of Children: N/A  . Years of Education:  N/A   Social History Main Topics  . Smoking status: Never Smoker   . Smokeless tobacco: None  . Alcohol Use: No  . Drug Use: No  . Sexual Activity: None   Other Topics Concern  . None   Social History Narrative  . None   Past Surgical History  Procedure Laterality Date  . Breast lumpectomy    . Knee surgery    . Breast lumpectomy     Past Medical History  Diagnosis Date  . Hypertension   . Atrial fibrillation    BP 150/68  Pulse 79  Resp 16  Ht 4\' 11"  (1.499 m)  Wt 117 lb (53.071 kg)  BMI 23.62 kg/m2  SpO2 96%     Review of Systems  HENT: Positive for neck pain.   Musculoskeletal: Positive for myalgias, back pain and arthralgias.  All other systems reviewed and are negative.       Objective:   Physical Exam  Constitutional: She appears well-developed.  HENT:  Head: Normocephalic.  Skin: Skin is warm and dry.  Psychiatric: She has a normal mood and affect.  Symmetric normal motor tone is noted throughout. General decrease in muscle bulk. Muscle testing reveals 5/5 muscle strength of the upper extremity, and 5/5 of the lower extremity.Restriction of motion in upper and lower extremities. ROM of cervical spine is restricted. ROM of left knee restricted extension by 10 degrees.  DTR  in the upper extremity are present and symmetric 2+, lower extremities could not elicit, but symmetrical. No clonus is noted.  Patient arises from chair with difficulty. She is walking with a walker now, which gives her more stability. Walking on heels and toes and Tandem walk not tested due to safety .        Assessment & Plan:   1. Cervical spondylosis, stable.( CT cervical spine 4/11: Advanced cervical spondylosis and kyphosis)  2. Severe shoulder osteoarthritis bilaterally, stable.  3. Right knee osteoarthritis and history of left knee replacement,  stable.  4. Lumbar spondylosis/spinal stenosis/neurogenic claudication.  5. Intermittent gout flare-ups which are managed by  her primary care  Physician. This has not been a problem recently.  6. Advised patient to continue her home exercise program in a sitting position. Advised patient to walk with her walker, but only if her daughter or somebody is helping her.  The Opioid medication pill count was appropriate. No reported significant side effects of Opioid medications noted. No aberrant behavior noted. Patient cautioned regarding operation of machinery or vehicles. Patient understands risk and benefits of these medications. There is no indication or report of risk to self or others.  The patient's daughter reports that the patient had an episode of constipation last month, because the patient would not drink like she should have.I educated the patient that the oxycodone can also cause constipation, and that she should try to wean down to 4/ day for 2 weeks and then hopefully to 3 a day. If the patient has problems with constipation again we might consider d/c the narcotics. The patient and her daughter report that the patient now is taking mirulex, and is drinking regularly, and also is eating some dried fruits. i encouraged her to continue with those, and the daughter should make sure that the patient is drinking etc. Regularly. Will refill meds, Percocet 5/325 not more than 5 per day. Patient has a heart condition, insufficient valves, per patient, she is very easily fatigued, getting to the office is putting a strain on her. Her pill count was always ok, no aberrant behavior, or signs of abuse of her narcotics. I gave her a second prescription, and will see her back in 2 month.

## 2012-12-19 NOTE — Patient Instructions (Signed)
Stay as active as tolerated and as it is safe. 

## 2013-01-20 ENCOUNTER — Telehealth: Payer: Self-pay

## 2013-01-20 NOTE — Telephone Encounter (Signed)
[facility] pharmacy called regarding patients percocet refill.  They need a call back from Clydie Braun to clairfy order.

## 2013-01-20 NOTE — Telephone Encounter (Signed)
Notified pharmacy.

## 2013-01-20 NOTE — Telephone Encounter (Signed)
Tell them the sig is, one tablet every 4-6 hrs, but not more than 5 tablets per day

## 2013-02-20 ENCOUNTER — Encounter
Payer: Medicare Other | Attending: Physical Medicine and Rehabilitation | Admitting: Physical Medicine and Rehabilitation

## 2013-02-20 ENCOUNTER — Encounter: Payer: Self-pay | Admitting: Physical Medicine and Rehabilitation

## 2013-02-20 VITALS — BP 138/70 | HR 73 | Resp 16 | Ht 59.0 in | Wt 115.0 lb

## 2013-02-20 DIAGNOSIS — M1710 Unilateral primary osteoarthritis, unspecified knee: Secondary | ICD-10-CM | POA: Insufficient documentation

## 2013-02-20 DIAGNOSIS — M48062 Spinal stenosis, lumbar region with neurogenic claudication: Secondary | ICD-10-CM | POA: Insufficient documentation

## 2013-02-20 DIAGNOSIS — M47817 Spondylosis without myelopathy or radiculopathy, lumbosacral region: Secondary | ICD-10-CM

## 2013-02-20 DIAGNOSIS — G894 Chronic pain syndrome: Secondary | ICD-10-CM

## 2013-02-20 DIAGNOSIS — M47812 Spondylosis without myelopathy or radiculopathy, cervical region: Secondary | ICD-10-CM | POA: Insufficient documentation

## 2013-02-20 DIAGNOSIS — M19019 Primary osteoarthritis, unspecified shoulder: Secondary | ICD-10-CM | POA: Insufficient documentation

## 2013-02-20 DIAGNOSIS — M109 Gout, unspecified: Secondary | ICD-10-CM | POA: Insufficient documentation

## 2013-02-20 DIAGNOSIS — Z96659 Presence of unspecified artificial knee joint: Secondary | ICD-10-CM | POA: Insufficient documentation

## 2013-02-20 MED ORDER — OXYCODONE-ACETAMINOPHEN 5-325 MG PO TABS: 1.0000 | ORAL_TABLET | ORAL | Status: DC | PRN

## 2013-02-20 NOTE — Patient Instructions (Signed)
Try to stay as active as tolerated and as it is safe

## 2013-02-20 NOTE — Progress Notes (Signed)
Subjective:    Patient ID: [name], female    DOB: Feb 08, 1918, 79 y.o.   MRN: [id number]  HPI The patient is a 79 year old woman who lived independently, since last visit her daughter moved in with her. She is seen here for chronic pain which is related to severe osteoarthritis of her neck, low back, shoulders, bilateral knees. She has a history of the left knee replacement. She has known lumbar spondyosis and spinal stenosis. She also has neurogenic claudication which limits her ambulation capacity. The problem has been stable, she did not have any new falls since her last visit.  Pain Inventory Average Pain 7 Pain Right Now 8 My pain is sharp, burning, dull, stabbing, tingling and aching  In the last 24 hours, has pain interfered with the following? General activity 10 Relation with others 10 Enjoyment of life 10 What TIME of day is your pain at its worst? all Sleep (in general) Fair  Pain is worse with: walking, bending, sitting, inactivity, standing and some activites Pain improves with: rest, heat/ice and medication Relief from Meds: 7  Mobility walk with assistance use a cane use a walker ability to climb steps?  no do you drive?  no  Function retired I need assistance with the following:  dressing, bathing, meal prep, household duties and shopping  Neuro/Psych weakness trouble walking  Prior Studies Any changes since last visit?  no  Physicians involved in your care Any changes since last visit?  no   Family History  Problem Relation Age of Onset  . Diabetes Sister   . Heart disease Sister    History   Social History  . Marital Status: Widowed    Spouse Name: N/A    Number of Children: N/A  . Years of Education: N/A   Social History Main Topics  . Smoking status: Never Smoker   . Smokeless tobacco: Never Used  . Alcohol Use: No  . Drug Use: No  . Sexual Activity: None   Other Topics Concern  . None   Social History Narrative  . None    Past Surgical History  Procedure Laterality Date  . Breast lumpectomy    . Knee surgery    . Breast lumpectomy     Past Medical History  Diagnosis Date  . Hypertension   . Atrial fibrillation    BP 138/70  Pulse 73  Resp 16  Ht 4\' 11"  (1.499 m)  Wt 115 lb (52.164 kg)  BMI 23.21 kg/m2  SpO2 97%    Review of Systems  Musculoskeletal: Positive for back pain and gait problem.  Neurological: Positive for weakness.  All other systems reviewed and are negative.       Objective:   Physical Exam Constitutional: She appears well-developed.  HENT:  Head: Normocephalic.  Skin: Skin is warm and dry.  Psychiatric: She has a normal mood and affect.  Symmetric normal motor tone is noted throughout. General decrease in muscle bulk. Muscle testing reveals 5/5 muscle strength of the upper extremity, and 5/5 of the lower extremity.Restriction of motion in upper and lower extremities. ROM of cervical spine is restricted. ROM of left knee restricted extension by 10 degrees.  DTR in the upper extremity are present and symmetric 2+, lower extremities could not elicit, but symmetrical. No clonus is noted.  Patient arises from chair with difficulty. She is walking with a walker now, which gives her more stability. Walking on heels and toes and Tandem walk not tested due to safety .  Assessment & Plan:  1. Cervical spondylosis, stable.( CT cervical spine 4/11: Advanced cervical spondylosis and kyphosis)  2. Severe shoulder osteoarthritis bilaterally, stable.  3. Right knee osteoarthritis and history of left knee replacement,  stable.  4. Lumbar spondylosis/spinal stenosis/neurogenic claudication.  5. Intermittent gout flare-ups which are managed by her primary care  Physician. This has not been a problem recently.  6. Advised patient to continue her home exercise program in a sitting position. Advised patient to walk with her walker, but only if her daughter or somebody is helping  her.  The Opioid medication pill count was appropriate. No reported significant side effects of Opioid medications noted. No aberrant behavior noted. Patient cautioned regarding operation of machinery or vehicles. Patient understands risk and benefits of these medications. There is no indication or report of risk to self or others.   Will refill meds, Percocet 5/325 not more than 5 per day. Patient has a heart condition, insufficient valves, per patient, she is very easily fatigued, getting to the office is putting a strain on her. Her pill count was always ok, no aberrant behavior, or signs of abuse of her narcotics.

## 2013-03-15 ENCOUNTER — Other Ambulatory Visit: Payer: Self-pay | Admitting: *Deleted

## 2013-03-15 MED ORDER — OXYCODONE-ACETAMINOPHEN 5-325 MG PO TABS: 1.0000 | ORAL_TABLET | ORAL | Status: DC | PRN

## 2013-03-15 NOTE — Telephone Encounter (Signed)
RX printed early for controlled medication for the visit with RN on 03/27/13 (to be signed by MD) 

## 2013-03-27 ENCOUNTER — Encounter: Payer: Self-pay | Admitting: *Deleted

## 2013-03-27 ENCOUNTER — Encounter: Payer: Medicare Other | Attending: Physical Medicine & Rehabilitation | Admitting: *Deleted

## 2013-03-27 VITALS — HR 68 | Resp 14

## 2013-03-27 DIAGNOSIS — M48062 Spinal stenosis, lumbar region with neurogenic claudication: Secondary | ICD-10-CM | POA: Insufficient documentation

## 2013-03-27 DIAGNOSIS — M19019 Primary osteoarthritis, unspecified shoulder: Secondary | ICD-10-CM | POA: Insufficient documentation

## 2013-03-27 DIAGNOSIS — M47812 Spondylosis without myelopathy or radiculopathy, cervical region: Secondary | ICD-10-CM | POA: Insufficient documentation

## 2013-03-27 DIAGNOSIS — G8929 Other chronic pain: Secondary | ICD-10-CM

## 2013-03-27 DIAGNOSIS — M1710 Unilateral primary osteoarthritis, unspecified knee: Secondary | ICD-10-CM | POA: Insufficient documentation

## 2013-03-27 NOTE — Patient Instructions (Signed)
Follow up one month with RN for pill count and med refill 

## 2013-03-27 NOTE — Progress Notes (Signed)
Here for pill count and medication refills. Oxycodone 5/325 # 150  Fill date 02/21/13     Today NV#1  Pill count is appropriate.  BP is hard to measure today  She does not like to use the electric bp machine because it hurts and the regular cuff is hurting her today.  Pulse ox is 97% within normal range and pulse is 68.  She is not complaining of dizziness or lightheadedness today.  Return in one month for follow up with RN for pill count and med refill.  She will see Dr Wynn Banker the following month.

## 2013-04-25 ENCOUNTER — Other Ambulatory Visit: Payer: Self-pay | Admitting: *Deleted

## 2013-04-25 MED ORDER — OXYCODONE-ACETAMINOPHEN 5-325 MG PO TABS: 1.0000 | ORAL_TABLET | ORAL | Status: DC | PRN

## 2013-04-25 NOTE — Telephone Encounter (Signed)
RX printed early for controlled medication for the visit with RN on 05/03/13 (to be signed by MD) 

## 2013-05-03 ENCOUNTER — Encounter: Payer: Medicare Other | Attending: Physical Medicine & Rehabilitation | Admitting: *Deleted

## 2013-05-03 ENCOUNTER — Encounter (INDEPENDENT_AMBULATORY_CARE_PROVIDER_SITE_OTHER): Payer: Self-pay

## 2013-05-03 VITALS — BP 162/80 | HR 80 | Resp 16 | Wt 115.6 lb

## 2013-05-03 DIAGNOSIS — M19019 Primary osteoarthritis, unspecified shoulder: Secondary | ICD-10-CM | POA: Insufficient documentation

## 2013-05-03 DIAGNOSIS — M1710 Unilateral primary osteoarthritis, unspecified knee: Secondary | ICD-10-CM | POA: Insufficient documentation

## 2013-05-03 DIAGNOSIS — M1990 Unspecified osteoarthritis, unspecified site: Secondary | ICD-10-CM | POA: Insufficient documentation

## 2013-05-03 DIAGNOSIS — M47817 Spondylosis without myelopathy or radiculopathy, lumbosacral region: Secondary | ICD-10-CM | POA: Insufficient documentation

## 2013-05-03 DIAGNOSIS — M47812 Spondylosis without myelopathy or radiculopathy, cervical region: Secondary | ICD-10-CM

## 2013-05-03 DIAGNOSIS — M1711 Unilateral primary osteoarthritis, right knee: Secondary | ICD-10-CM

## 2013-05-03 NOTE — Progress Notes (Signed)
Here for pill count and medication refills. Oxycodone 5-325 #150  Fill date 03/28/13    Today NV# 2  VSS    She uses a walker and has had no falls.  Information given on fall prevention in the home handout was given.  She will return to see DR Kirsteins in a month.  No changes in pain or to her medications. Refill given for her oxycodone 5-325 #150.

## 2013-05-03 NOTE — Patient Instructions (Signed)
Follow up one month with Dr Kirsteins  

## 2013-05-30 ENCOUNTER — Ambulatory Visit (HOSPITAL_BASED_OUTPATIENT_CLINIC_OR_DEPARTMENT_OTHER): Payer: Medicare Other | Admitting: Physical Medicine & Rehabilitation

## 2013-05-30 ENCOUNTER — Encounter: Payer: Medicare Other | Attending: Physical Medicine & Rehabilitation

## 2013-05-30 ENCOUNTER — Encounter: Payer: Self-pay | Admitting: Physical Medicine & Rehabilitation

## 2013-05-30 VITALS — HR 76 | Resp 14 | Ht 59.0 in | Wt 114.0 lb

## 2013-05-30 DIAGNOSIS — G8929 Other chronic pain: Secondary | ICD-10-CM

## 2013-05-30 DIAGNOSIS — M47812 Spondylosis without myelopathy or radiculopathy, cervical region: Secondary | ICD-10-CM | POA: Insufficient documentation

## 2013-05-30 DIAGNOSIS — M1710 Unilateral primary osteoarthritis, unspecified knee: Secondary | ICD-10-CM | POA: Insufficient documentation

## 2013-05-30 DIAGNOSIS — M48062 Spinal stenosis, lumbar region with neurogenic claudication: Secondary | ICD-10-CM

## 2013-05-30 DIAGNOSIS — M19019 Primary osteoarthritis, unspecified shoulder: Secondary | ICD-10-CM

## 2013-05-30 DIAGNOSIS — M2550 Pain in unspecified joint: Secondary | ICD-10-CM

## 2013-05-30 MED ORDER — OXYCODONE-ACETAMINOPHEN 5-325 MG PO TABS: 1.0000 | ORAL_TABLET | ORAL | Status: DC | PRN

## 2013-05-30 NOTE — Patient Instructions (Signed)
Continue Senokot  Next visit will be with Zella Ball our new mid level provider

## 2013-05-30 NOTE — Progress Notes (Signed)
Subjective:    Patient ID: [name], female    DOB: March 04, 1918, 80 y.o.   MRN: [id number]  HPI Chief complaint chronic back,, chronic neck and chronic shoulder pain No new changes since last visit. No falls. Ambulating with a rolling walker. Independent with sink bathing as well as dressing Taking pain medications 5 times per day. Using Senokot S. everyday for laxative, bowels are regular  Review of Systems  Musculoskeletal: Positive for arthralgias, back pain, gait problem and myalgias.  All other systems reviewed and are negative.   Pain Inventory Average Pain 7 Pain Right Now 7 My pain is sharp, burning, dull, stabbing and aching  In the last 24 hours, has pain interfered with the following? General activity 10 Relation with others 10 Enjoyment of life 10 What TIME of day is your pain at its worst? constant Sleep (in general) Fair  Pain is worse with: walking, bending, sitting and standing Pain improves with: rest, heat/ice and medication Relief from Meds: 9  Mobility walk with assistance use a walker how many minutes can you walk? 3-4 ability to climb steps?  no do you drive?  no use a wheelchair needs help with transfers Do you have any goals in this area?  no  Function retired I need assistance with the following:  meal prep, household duties and shopping Do you have any goals in this area?  no  Neuro/Psych No problems in this area  Prior Studies Any changes since last visit?  no  Physicians involved in your care Any changes since last visit?  no   Family History  Problem Relation Age of Onset  . Diabetes Sister   . Heart disease Sister    History   Social History  . Marital Status: Widowed    Spouse Name: N/A    Number of Children: N/A  . Years of Education: N/A   Social History Main Topics  . Smoking status: Never Smoker   . Smokeless tobacco: Never Used  . Alcohol Use: No  . Drug Use: No  . Sexual Activity: None   Other  Topics Concern  . None   Social History Narrative  . None   Past Surgical History  Procedure Laterality Date  . Breast lumpectomy    . Knee surgery    . Breast lumpectomy     Past Medical History  Diagnosis Date  . Hypertension   . Atrial fibrillation    BP   Pulse 76  Resp 14  Ht 4\' 11"  (1.499 m)  Wt 114 lb (51.71 kg)  BMI 23.01 kg/m2  SpO2 99%  Opioid Risk Score:   Fall Risk Score: Moderate Fall Risk (6-13 points) (patient educated handout declined)   Review of Systems  Musculoskeletal: Positive for arthralgias, back pain, gait problem and myalgias.  All other systems reviewed and are negative.       Objective:   Physical Exam  Ltd. cervical range of motion rotation to left less than 25% rotation to right 50%, cervical flexion 100%, cervical extension 50% Bilateral shoulder range of motion reduced 80 abduction and flexion. Grip strength 4/5 biceps triceps 4/5 Hip flexor knee extensor 4/5 No tenderness to palpation along the cervical  paraspinals or the thoracic or lumbar paraspinal muscles. Thoracic spine demonstrates kyphosis Hip with good internal/external rotation. Negative straight leg raising      Assessment & Plan:  1. Lumbar spinal stenosis no sign of radiculopathy chronic low back pain continue oxycodone 5 mg 5 times  per day no signs of side effects 2. Cervical spondylosis with extremely limited range of motion 3. Shoulder osteoarthritis with contracture, a stone age and degree of contracture do not think PT would be a potential benefits at this time  Return to clinic one month mid-level visit

## 2013-07-04 ENCOUNTER — Encounter: Payer: Medicare Other | Attending: Physical Medicine & Rehabilitation

## 2013-07-04 ENCOUNTER — Ambulatory Visit (HOSPITAL_BASED_OUTPATIENT_CLINIC_OR_DEPARTMENT_OTHER): Payer: Medicare Other | Admitting: Physical Medicine & Rehabilitation

## 2013-07-04 ENCOUNTER — Encounter: Payer: Self-pay | Admitting: Physical Medicine & Rehabilitation

## 2013-07-04 VITALS — BP 152/90 | HR 76 | Resp 14 | Ht 59.0 in | Wt 111.0 lb

## 2013-07-04 DIAGNOSIS — M2550 Pain in unspecified joint: Secondary | ICD-10-CM

## 2013-07-04 DIAGNOSIS — M47812 Spondylosis without myelopathy or radiculopathy, cervical region: Secondary | ICD-10-CM

## 2013-07-04 DIAGNOSIS — M48062 Spinal stenosis, lumbar region with neurogenic claudication: Secondary | ICD-10-CM | POA: Insufficient documentation

## 2013-07-04 DIAGNOSIS — G8929 Other chronic pain: Secondary | ICD-10-CM

## 2013-07-04 DIAGNOSIS — M19019 Primary osteoarthritis, unspecified shoulder: Secondary | ICD-10-CM

## 2013-07-04 DIAGNOSIS — M1710 Unilateral primary osteoarthritis, unspecified knee: Secondary | ICD-10-CM | POA: Insufficient documentation

## 2013-07-04 MED ORDER — OXYCODONE-ACETAMINOPHEN 5-325 MG PO TABS
1.0000 | ORAL_TABLET | ORAL | Status: DC | PRN
Start: 2013-07-04 — End: 2013-08-02

## 2013-07-04 NOTE — Progress Notes (Signed)
Subjective:    Patient ID: [name], female    DOB: Sep 30, 1917, 80 y.o.   MRN: [id number]  HPI Pt lost close friend Daughter shared PCP notes with me which I reviewed  Pain Inventory Average Pain 8 Pain Right Now 8 My pain is sharp, burning, dull, stabbing and aching  In the last 24 hours, has pain interfered with the following? General activity 0 Relation with others 0 Enjoyment of life 0 What TIME of day is your pain at its worst? n/a Sleep (in general) Fair  Pain is worse with: some activites Pain improves with: n/a Relief from Meds: n/a  Mobility walk with assistance use a walker how many minutes can you walk? 3-4 ability to climb steps?  no do you drive?  no use a wheelchair needs help with transfers Do you have any goals in this area?  no  Function Do you have any goals in this area?  no  Neuro/Psych No problems in this area  Prior Studies Any changes since last visit?  no  Physicians involved in your care Any changes since last visit?  no   Family History  Problem Relation Age of Onset  . Diabetes Sister   . Heart disease Sister    History   Social History  . Marital Status: Widowed    Spouse Name: N/A    Number of Children: N/A  . Years of Education: N/A   Social History Main Topics  . Smoking status: Never Smoker   . Smokeless tobacco: Never Used  . Alcohol Use: No  . Drug Use: No  . Sexual Activity: None   Other Topics Concern  . None   Social History Narrative  . None   Past Surgical History  Procedure Laterality Date  . Breast lumpectomy    . Knee surgery    . Breast lumpectomy     Past Medical History  Diagnosis Date  . Hypertension   . Atrial fibrillation    BP 152/90  Pulse 76  Resp 14  Ht 4\' 11"  (1.499 m)  Wt 111 lb (50.349 kg)  BMI 22.41 kg/m2  SpO2 %  Opioid Risk Score:   Fall Risk Score: High Fall Risk (>13 points) (patient educated handout declined)   Review of Systems  Musculoskeletal:  Positive for arthralgias and myalgias.  All other systems reviewed and are negative.       Objective:   Physical Exam  Ltd. cervical range of motion rotation to left less than 25% rotation to right 50%, cervical flexion 100%, cervical extension 50%  Bilateral shoulder range of motion reduced 80 abduction and flexion. Grip strength 4/5 biceps triceps 4/5  Hip flexor knee extensor 4/5  No tenderness to palpation along the cervical paraspinals or the thoracic or lumbar paraspinal muscles.  Thoracic spine demonstrates kyphosis  Hip with good internal/external rotation.  Negative straight leg raising Oriented to person place and month and day but not year      Assessment & Plan:  1. Lumbar spinal stenosis no sign of radiculopathy chronic low back pain continue oxycodone 5 mg 5 times per day no signs of side effects  2. Cervical spondylosis with extremely limited range of motion  3. Shoulder osteoarthritis with contracture, a stone age and degree of contracture do not think PT would be a potential benefits at this time  Return to clinic one month mid-level visit Patient asked about having visits on a less frequent basis. We discussed that she is on  a high-risk medication for side effects, she is also a high-risk patient given her age, fall risk, warfarin use She has all ready tried nonnarcotic treatment options and tramadol Continue monthly visits

## 2013-07-04 NOTE — Patient Instructions (Signed)
Monthly visit with nurse practitioner, yearly visit with M.D.

## 2013-07-27 ENCOUNTER — Other Ambulatory Visit: Payer: Self-pay | Admitting: Gastroenterology

## 2013-07-27 DIAGNOSIS — R1310 Dysphagia, unspecified: Secondary | ICD-10-CM

## 2013-07-28 ENCOUNTER — Ambulatory Visit
Admission: RE | Admit: 2013-07-28 | Discharge: 2013-07-28 | Disposition: A | Discharge status: Home / Self Care | Payer: Medicare Other | Source: Ambulatory Visit | Attending: Gastroenterology | Admitting: Gastroenterology

## 2013-07-28 DIAGNOSIS — R1310 Dysphagia, unspecified: Secondary | ICD-10-CM

## 2013-08-02 ENCOUNTER — Encounter: Payer: Self-pay | Admitting: Registered Nurse

## 2013-08-02 ENCOUNTER — Encounter: Payer: Medicare Other | Attending: Physical Medicine & Rehabilitation | Admitting: Registered Nurse

## 2013-08-02 VITALS — BP 148/80 | HR 80 | Resp 14 | Ht 59.0 in | Wt 108.2 lb

## 2013-08-02 DIAGNOSIS — M47812 Spondylosis without myelopathy or radiculopathy, cervical region: Secondary | ICD-10-CM | POA: Insufficient documentation

## 2013-08-02 DIAGNOSIS — Z79899 Other long term (current) drug therapy: Secondary | ICD-10-CM

## 2013-08-02 DIAGNOSIS — M19019 Primary osteoarthritis, unspecified shoulder: Secondary | ICD-10-CM | POA: Insufficient documentation

## 2013-08-02 DIAGNOSIS — M2550 Pain in unspecified joint: Secondary | ICD-10-CM

## 2013-08-02 DIAGNOSIS — M48062 Spinal stenosis, lumbar region with neurogenic claudication: Secondary | ICD-10-CM

## 2013-08-02 DIAGNOSIS — M1990 Unspecified osteoarthritis, unspecified site: Secondary | ICD-10-CM | POA: Insufficient documentation

## 2013-08-02 DIAGNOSIS — M1710 Unilateral primary osteoarthritis, unspecified knee: Secondary | ICD-10-CM | POA: Insufficient documentation

## 2013-08-02 DIAGNOSIS — G8929 Other chronic pain: Secondary | ICD-10-CM

## 2013-08-02 DIAGNOSIS — M47817 Spondylosis without myelopathy or radiculopathy, lumbosacral region: Secondary | ICD-10-CM | POA: Insufficient documentation

## 2013-08-02 DIAGNOSIS — Z5181 Encounter for therapeutic drug level monitoring: Secondary | ICD-10-CM

## 2013-08-02 MED ORDER — OXYCODONE-ACETAMINOPHEN 5-325 MG PO TABS: 1.0000 | ORAL_TABLET | ORAL | Status: DC | PRN

## 2013-08-02 NOTE — Progress Notes (Signed)
BP 148/80  P 80  R14 O2 sat 98 Wt 108.2lb  Ht 59"

## 2013-08-02 NOTE — Progress Notes (Signed)
Subjective:    Patient ID: [name], female    DOB: 1917-09-11, 80 y.o.   MRN: [id number]  HPI: [name] is a 80 year old female who returns for follow up for chronic pain and medication refill. She says her pain is all over her body. With a concentration in bilateral shoulders, upper and lower back and bilateral knees. She rates her pain 8. Her current exercise regime is walking in her home. She spends most of her time in the bed per daughter.  Her daughter sayss she was having some swallowing problems. She had a barium swallowing procedure by Dr. Cristina Gong she was instructed to eat in small bites with a baby spoon and fork.  Pain Inventory Average Pain 8 Pain Right Now 8 My pain is sharp, burning, dull, stabbing and aching  In the last 24 hours, has pain interfered with the following? General activity 10 Relation with others 10 Enjoyment of life 10 What TIME of day is your pain at its worst? all Sleep (in general) Fair  Pain is worse with: walking, bending, sitting, inactivity and standing Pain improves with: rest, heat/ice and medication Relief from Meds: 8  Mobility walk with assistance use a walker  Function retired I need assistance with the following:  meal prep, household duties and shopping  Neuro/Psych No problems in this area  Prior Studies Any changes since last visit?  no  Physicians involved in your care Any changes since last visit?  no   Family History  Problem Relation Age of Onset  . Diabetes Sister   . Heart disease Sister    History   Social History  . Marital Status: Widowed    Spouse Name: N/A    Number of Children: N/A  . Years of Education: N/A   Social History Main Topics  . Smoking status: Never Smoker   . Smokeless tobacco: Never Used  . Alcohol Use: No  . Drug Use: No  . Sexual Activity: None   Other Topics Concern  . None   Social History Narrative  . None   Past Surgical History  Procedure Laterality Date    . Breast lumpectomy    . Knee surgery    . Breast lumpectomy     Past Medical History  Diagnosis Date  . Hypertension   . Atrial fibrillation    There were no vitals taken for this visit.  Opioid Risk Score:   Fall Risk Score: High Fall Risk (>13 points) (educated and handout given for fallprevention in the home)  Review of Systems  Musculoskeletal: Positive for back pain.  All other systems reviewed and are negative.      Objective:   Physical Exam  Nursing note and vitals reviewed. Constitutional: She is oriented to person, place, and time. She appears well-developed and well-nourished.  HENT:  Head: Normocephalic and atraumatic.  Neck:  Cervical Paraspinal Tenderness: Decreased ROM: 20-25% on Left side Right 40%.   Cardiovascular:  Murmur heard. S1S2 IRR with SEM  Pulmonary/Chest: Effort normal and breath sounds normal.  Musculoskeletal:  Normal Muscle Bulk: Muscle testing Reveals: Upper extremities: Decreased ROM:30% Bilateral Deltoid Muscle Tenderness Thoracic Paraspinal Tenderness Noted T-1- T-3 Np Lumbar Paraspinal tenderness Lower Extremities: Left Lower extremity: Full ROM Right Lower Extremity: Decreased ROM. Lateral Joint Line Tenderness at patella. Walks with a walker Narrow Based Gait.  Neurological: She is alert and oriented to person, place, and time.  Skin: Skin is warm and dry.  Psychiatric: She  has a normal mood and affect.          Assessment & Plan:  1. Lumbar spinal stenosis and Chronic low back pain. Refilled: oxyCODONE 5/325 mg One tablet every 4 hours as needed not more than five a day.  2. Cervical spondylosis with extremely limited range of motion: Continue Current Medication Regime 3. Shoulder osteoarthritis with contracture: Continue Current Medication regime.  She asked if she could come to office every couple of months. I explain the narcotic contract and medication she is taking requires monthly visits. She verbalized  understanding. Daughter understands and agrees.  20 minutes of face to face patient care time was spent during this visit. All questions were encouraged and answered.

## 2013-08-31 ENCOUNTER — Encounter: Payer: Medicare Other | Attending: Physical Medicine & Rehabilitation | Admitting: Registered Nurse

## 2013-08-31 ENCOUNTER — Encounter: Payer: Self-pay | Admitting: Registered Nurse

## 2013-08-31 VITALS — BP 148/82 | HR 80 | Resp 16 | Ht 59.0 in | Wt 108.0 lb

## 2013-08-31 DIAGNOSIS — M47812 Spondylosis without myelopathy or radiculopathy, cervical region: Secondary | ICD-10-CM | POA: Insufficient documentation

## 2013-08-31 DIAGNOSIS — Z5181 Encounter for therapeutic drug level monitoring: Secondary | ICD-10-CM

## 2013-08-31 DIAGNOSIS — M1990 Unspecified osteoarthritis, unspecified site: Secondary | ICD-10-CM | POA: Insufficient documentation

## 2013-08-31 DIAGNOSIS — M19019 Primary osteoarthritis, unspecified shoulder: Secondary | ICD-10-CM | POA: Insufficient documentation

## 2013-08-31 DIAGNOSIS — M48062 Spinal stenosis, lumbar region with neurogenic claudication: Secondary | ICD-10-CM

## 2013-08-31 DIAGNOSIS — M2550 Pain in unspecified joint: Secondary | ICD-10-CM

## 2013-08-31 DIAGNOSIS — Z79899 Other long term (current) drug therapy: Secondary | ICD-10-CM

## 2013-08-31 DIAGNOSIS — M1710 Unilateral primary osteoarthritis, unspecified knee: Secondary | ICD-10-CM | POA: Insufficient documentation

## 2013-08-31 DIAGNOSIS — G8929 Other chronic pain: Secondary | ICD-10-CM

## 2013-08-31 DIAGNOSIS — M47817 Spondylosis without myelopathy or radiculopathy, lumbosacral region: Secondary | ICD-10-CM | POA: Insufficient documentation

## 2013-08-31 MED ORDER — OXYCODONE-ACETAMINOPHEN 5-325 MG PO TABS: 1.0000 | ORAL_TABLET | ORAL | Status: DC | PRN

## 2013-08-31 NOTE — Progress Notes (Signed)
Subjective:    Patient ID: [name], female    DOB: 04/15/1917, 79 y.o.   MRN: [id number]  HPI: [name] is a 80 year old female who returns for follow up for chronic pain and medication refill. She says her pain is located all over body. She rates her pain 8. Her current exercise regime is walking in her home from the bathroom to the kitchen and walking from the bedroom to the bathroom.  Pain Inventory Average Pain 8 Pain Right Now 8 My pain is sharp, burning, dull, stabbing and aching  In the last 24 hours, has pain interfered with the following? General activity 10 Relation with others 10 Enjoyment of life 10 What TIME of day is your pain at its worst? constant Sleep (in general) Fair  Pain is worse with: walking, bending, sitting, inactivity and standing Pain improves with: rest, heat/ice and medication Relief from Meds: 8  Mobility walk with assistance use a walker how many minutes can you walk? 5 ability to climb steps?  no do you drive?  no Do you have any goals in this area?  no  Function retired Do you have any goals in this area?  no  Neuro/Psych trouble walking  Prior Studies Any changes since last visit?  no  Physicians involved in your care Any changes since last visit?  no   Family History  Problem Relation Age of Onset  . Diabetes Sister   . Heart disease Sister    History   Social History  . Marital Status: Widowed    Spouse Name: N/A    Number of Children: N/A  . Years of Education: N/A   Social History Main Topics  . Smoking status: Never Smoker   . Smokeless tobacco: Never Used  . Alcohol Use: No  . Drug Use: No  . Sexual Activity: None   Other Topics Concern  . None   Social History Narrative  . None   Past Surgical History  Procedure Laterality Date  . Breast lumpectomy    . Knee surgery    . Breast lumpectomy     Past Medical History  Diagnosis Date  . Hypertension   . Atrial fibrillation    BP  148/82  Pulse 80  Resp 16  Ht 4\' 11"  (1.499 m)  Wt 108 lb (48.988 kg)  BMI 21.80 kg/m2  Opioid Risk Score:   Fall Risk Score: Moderate Fall Risk (6-13 points) (patient educated handout declined)   Review of Systems  Musculoskeletal: Positive for arthralgias, gait problem and myalgias.  All other systems reviewed and are negative.      Objective:   Physical Exam  Nursing note and vitals reviewed. Constitutional: She is oriented to person, place, and time. She appears well-developed and well-nourished.  HENT:  Head: Normocephalic and atraumatic.  Neck: Normal range of motion. Neck supple.  Cardiovascular: Normal rate and regular rhythm.   Pulmonary/Chest: Effort normal and breath sounds normal.  Musculoskeletal:  Normal Muscle Bulk and Muscle testing Reveals: Upper Extremities: Bilateral Arms with decreased ROM 45 degrees. Muscle strength in right arm 4/5 and left arm 5/5. Bilateral AC Joint Tenderness Spine forward flexion 30 degrees Lumbar Paraspinal tenderness: L-3- L-5 Lower Extremities: Left Leg Full ROM and Muscle Strength 4/5 Right Leg Flexion produces pain into patella, no swelling or tenderness noted. She uses a walker Arises from chair with ease   Neurological: She is alert and oriented to person, place, and time.  Skin:  Skin is warm and dry.  Psychiatric: She has a normal mood and affect.          Assessment & Plan:  1. Lumbar spinal stenosis and Chronic low back pain.  Refilled: oxyCODONE 5/325 mg One tablet every 4 hours as needed not more than five a day. #150 2. Cervical spondylosis with extremely limited range of motion: Continue Current Medication Regime  3. Shoulder osteoarthritis with contracture: Continue Current Medication regime.  20 minutes of face to face patient care time was spent during this visit. All questions were encouraged and answered.  F/U in 1 month

## 2013-10-02 ENCOUNTER — Encounter: Payer: Self-pay | Admitting: Registered Nurse

## 2013-10-02 ENCOUNTER — Encounter: Payer: Medicare Other | Attending: Physical Medicine & Rehabilitation | Admitting: Registered Nurse

## 2013-10-02 VITALS — BP 159/89 | HR 81 | Resp 14 | Ht 60.0 in | Wt 110.0 lb

## 2013-10-02 DIAGNOSIS — M1990 Unspecified osteoarthritis, unspecified site: Secondary | ICD-10-CM | POA: Diagnosis not present

## 2013-10-02 DIAGNOSIS — M19019 Primary osteoarthritis, unspecified shoulder: Secondary | ICD-10-CM

## 2013-10-02 DIAGNOSIS — M47817 Spondylosis without myelopathy or radiculopathy, lumbosacral region: Secondary | ICD-10-CM | POA: Diagnosis not present

## 2013-10-02 DIAGNOSIS — Z5181 Encounter for therapeutic drug level monitoring: Secondary | ICD-10-CM

## 2013-10-02 DIAGNOSIS — M19011 Primary osteoarthritis, right shoulder: Secondary | ICD-10-CM

## 2013-10-02 DIAGNOSIS — M47812 Spondylosis without myelopathy or radiculopathy, cervical region: Secondary | ICD-10-CM | POA: Insufficient documentation

## 2013-10-02 DIAGNOSIS — M19012 Primary osteoarthritis, left shoulder: Secondary | ICD-10-CM

## 2013-10-02 DIAGNOSIS — Z79899 Other long term (current) drug therapy: Secondary | ICD-10-CM

## 2013-10-02 DIAGNOSIS — G8929 Other chronic pain: Secondary | ICD-10-CM

## 2013-10-02 DIAGNOSIS — M1710 Unilateral primary osteoarthritis, unspecified knee: Secondary | ICD-10-CM | POA: Insufficient documentation

## 2013-10-02 DIAGNOSIS — M2550 Pain in unspecified joint: Secondary | ICD-10-CM

## 2013-10-02 DIAGNOSIS — M48062 Spinal stenosis, lumbar region with neurogenic claudication: Secondary | ICD-10-CM

## 2013-10-02 MED ORDER — OXYCODONE-ACETAMINOPHEN 5-325 MG PO TABS: 1.0000 | ORAL_TABLET | ORAL | Status: DC | PRN

## 2013-10-02 NOTE — Progress Notes (Signed)
Subjective:    Patient ID: [name], female    DOB: 04/19/1917, 80 y.o.   MRN: [id number]  HPI: [name] is a 80 year old female who returns for follow up for chronic pain and medication refill. She says her pain is located all over body. She rates her pain 9. Her current exercise regime is walking in her home from the bathroom to the kitchen and walking from the bedroom to the bathroom states her daughter.     Pain Inventory Average Pain 8 Pain Right Now 9 My pain is constant, sharp, burning, dull, stabbing, tingling and aching  In the last 24 hours, has pain interfered with the following? General activity 10 Relation with others 10 Enjoyment of life 10 What TIME of day is your pain at its worst? constant all day Sleep (in general) Fair  Pain is worse with: walking, bending, sitting, inactivity and standing Pain improves with: rest, heat/ice and medication Relief from Meds: 8  Mobility walk with assistance use a walker how many minutes can you walk? 5 ability to climb steps?  no do you drive?  no use a wheelchair needs help with transfers Do you have any goals in this area?  no  Function retired I need assistance with the following:  feeding, dressing, bathing, toileting, meal prep, household duties and shopping Do you have any goals in this area?  no  Neuro/Psych No problems in this area  Prior Studies Any changes since last visit?  no  Physicians involved in your care Any changes since last visit?  no   Family History  Problem Relation Age of Onset  . Diabetes Sister   . Heart disease Sister    History   Social History  . Marital Status: Widowed    Spouse Name: N/A    Number of Children: N/A  . Years of Education: N/A   Social History Main Topics  . Smoking status: Never Smoker   . Smokeless tobacco: Never Used  . Alcohol Use: No  . Drug Use: No  . Sexual Activity: None   Other Topics Concern  . None   Social History  Narrative  . None   Past Surgical History  Procedure Laterality Date  . Breast lumpectomy    . Knee surgery    . Breast lumpectomy     Past Medical History  Diagnosis Date  . Hypertension   . Atrial fibrillation    BP 159/89  Pulse 81  Resp 14  Ht 5' (1.524 m)  Wt 110 lb (49.896 kg)  BMI 21.48 kg/m2  SpO2 95%  Opioid Risk Score:   Fall Risk Score: Moderate Fall Risk (6-13 points) (pt eductaed on fall risk, brochure given to pt previously)   Review of Systems  Musculoskeletal: Positive for back pain.  All other systems reviewed and are negative.      Objective:   Physical Exam  Nursing note and vitals reviewed. Constitutional: She is oriented to person, place, and time. She appears well-developed and well-nourished.  HENT:  Head: Normocephalic and atraumatic.  Neck: Normal range of motion. Neck supple.  Cardiovascular:  SiS2IRR  Pulmonary/Chest: Effort normal and breath sounds normal.  Musculoskeletal: She exhibits edema.  Normal Muscle Bulk and Muscle Testing Reveals: Upper Extremities: Decreased ROM 60 Degrees and Muscle strength 4/4 Lower Extremities: Full ROM and Muscle Strength 5/5 Right Leg Flexion Produces Pain into Patella Lower Extremities with 3+ Pitting Edema Arises from chair with ease Narrow Based  Gait Using walker  Neurological: She is alert and oriented to person, place, and time.  Skin: Skin is warm and dry.  Psychiatric: She has a normal mood and affect.          Assessment & Plan:  1. Lumbar spinal stenosis and Chronic low back pain.  Refilled: oxyCODONE 5/325 mg One tablet every 4 hours as needed not more than five a day. #150  2. Cervical spondylosis with extremely limited range of motion: Continue Current Medication Regime  3. Shoulder osteoarthritis with contracture: Continue Current Medication regime.   20 minutes of face to face patient care time was spent during this visit. All questions were encouraged and answered.   F/U in  1 month

## 2013-11-02 ENCOUNTER — Encounter: Payer: Medicare Other | Attending: Physical Medicine & Rehabilitation | Admitting: Registered Nurse

## 2013-11-02 ENCOUNTER — Encounter: Payer: Self-pay | Admitting: Registered Nurse

## 2013-11-02 VITALS — BP 160/80 | HR 74 | Resp 14 | Ht 59.0 in | Wt 105.0 lb

## 2013-11-02 DIAGNOSIS — Z79899 Other long term (current) drug therapy: Secondary | ICD-10-CM | POA: Insufficient documentation

## 2013-11-02 DIAGNOSIS — Z5181 Encounter for therapeutic drug level monitoring: Secondary | ICD-10-CM | POA: Diagnosis present

## 2013-11-02 DIAGNOSIS — M48062 Spinal stenosis, lumbar region with neurogenic claudication: Secondary | ICD-10-CM | POA: Diagnosis present

## 2013-11-02 DIAGNOSIS — M47812 Spondylosis without myelopathy or radiculopathy, cervical region: Secondary | ICD-10-CM | POA: Insufficient documentation

## 2013-11-02 DIAGNOSIS — M19019 Primary osteoarthritis, unspecified shoulder: Secondary | ICD-10-CM | POA: Insufficient documentation

## 2013-11-02 DIAGNOSIS — M2550 Pain in unspecified joint: Secondary | ICD-10-CM | POA: Insufficient documentation

## 2013-11-02 DIAGNOSIS — M19011 Primary osteoarthritis, right shoulder: Secondary | ICD-10-CM

## 2013-11-02 DIAGNOSIS — M19012 Primary osteoarthritis, left shoulder: Secondary | ICD-10-CM

## 2013-11-02 MED ORDER — OXYCODONE-ACETAMINOPHEN 5-325 MG PO TABS: 1.0000 | ORAL_TABLET | ORAL | Status: DC | PRN

## 2013-11-02 NOTE — Progress Notes (Signed)
Subjective:    Patient ID: [name], female    DOB: Sep 28, 1917, 80 y.o.   MRN: [id number]  HPI: [name] is a 80 year old female who returns for follow up for chronic pain and medication refill. She says her pain is in her bilateral shoulders and right knee also states it's located all over her body. She rates her pain 8. Her current exercise regime is walking in her home from the bathroom to the kitchen and walking from the bedroom to the bathroom states her daughter.   She had a fall last month, she was getting on the bedside commode and didn't have a handle on the handles. She fell on her left side. Her daughter and family picked her up from the floor, she didn't seek medical attention. Educated on falls prevention and she verbalizes understanding.  Pain Inventory Average Pain 8 Pain Right Now 8 My pain is constant  In the last 24 hours, has pain interfered with the following? General activity 10 Relation with others 10 Enjoyment of life 10 What TIME of day is your pain at its worst? constant all day Sleep (in general) Fair  Pain is worse with: walking, bending, sitting, inactivity and standing Pain improves with: rest, heat/ice and medication Relief from Meds: 5  Mobility walk with assistance use a walker how many minutes can you walk? 4 ability to climb steps?  no do you drive?  no use a wheelchair needs help with transfers Do you have any goals in this area?  no  Function I need assistance with the following:  feeding, dressing, meal prep, household duties and shopping Do you have any goals in this area?  no  Neuro/Psych No problems in this area  Prior Studies Any changes since last visit?  no  Physicians involved in your care Any changes since last visit?  no   Family History  Problem Relation Age of Onset  . Diabetes Sister   . Heart disease Sister    History   Social History  . Marital Status: Widowed    Spouse Name: N/A    Number of  Children: N/A  . Years of Education: N/A   Social History Main Topics  . Smoking status: Never Smoker   . Smokeless tobacco: Never Used  . Alcohol Use: No  . Drug Use: No  . Sexual Activity: None   Other Topics Concern  . None   Social History Narrative  . None   Past Surgical History  Procedure Laterality Date  . Breast lumpectomy    . Knee surgery    . Breast lumpectomy     Past Medical History  Diagnosis Date  . Hypertension   . Atrial fibrillation    Pulse 74  Resp 14  Ht 4\' 11"  (1.499 m)  Wt 105 lb (47.628 kg)  BMI 21.20 kg/m2  SpO2 92%  Opioid Risk Score:   Fall Risk Score: High Fall Risk (>13 points) (pt educated  on fall risk, brochure given to pt previously)    Review of Systems  All other systems reviewed and are negative.      Objective:   Physical Exam  Nursing note and vitals reviewed. Constitutional: She is oriented to person, place, and time. She appears well-developed and well-nourished.  HENT:  Head: Normocephalic and atraumatic.  Neck: Normal range of motion. Neck supple.  Cardiovascular: Normal rate, regular rhythm and normal heart sounds.   Pulmonary/Chest: Effort normal and breath sounds normal.  Musculoskeletal:  Normal Muscle Bulk and Muscle testing Reveals: Upper Extremities: Decreased ROM 45 Degrees and Muscle Strength 5/5 Left Spine of Scapula Tenderness Lower Extremities: Full ROM and Muscle strength 5/5 Right Leg Flexion Produces Pain into Patella, No Swelling or Tenderness Noted. Arises from chair with ease Uses Walker for support     Neurological: She is alert and oriented to person, place, and time.  Skin: Skin is warm and dry.  Psychiatric: She has a normal mood and affect.          Assessment & Plan:  1. Lumbar spinal stenosis and Chronic low back pain.  Refilled: oxyCODONE 5/325 mg One tablet every 4 hours as needed not more than five a day. #150  2. Cervical spondylosis with extremely limited range of  motion: Continue Current Medication Regime  3. Shoulder osteoarthritis with contracture: Continue Current Medication regime.   24minutes of face to face patient care time was spent during this visit. All questions were encouraged and answered.   F/U in 1 month

## 2013-12-05 ENCOUNTER — Encounter: Payer: Medicare Other | Attending: Physical Medicine & Rehabilitation | Admitting: Registered Nurse

## 2013-12-05 ENCOUNTER — Encounter: Payer: Self-pay | Admitting: Registered Nurse

## 2013-12-05 VITALS — BP 151/88 | HR 81 | Resp 16 | Ht <= 58 in | Wt 115.0 lb

## 2013-12-05 DIAGNOSIS — M2550 Pain in unspecified joint: Secondary | ICD-10-CM

## 2013-12-05 DIAGNOSIS — M19019 Primary osteoarthritis, unspecified shoulder: Secondary | ICD-10-CM | POA: Insufficient documentation

## 2013-12-05 DIAGNOSIS — Z5181 Encounter for therapeutic drug level monitoring: Secondary | ICD-10-CM | POA: Insufficient documentation

## 2013-12-05 DIAGNOSIS — Z79899 Other long term (current) drug therapy: Secondary | ICD-10-CM | POA: Diagnosis present

## 2013-12-05 DIAGNOSIS — M48062 Spinal stenosis, lumbar region with neurogenic claudication: Secondary | ICD-10-CM | POA: Insufficient documentation

## 2013-12-05 DIAGNOSIS — M47812 Spondylosis without myelopathy or radiculopathy, cervical region: Secondary | ICD-10-CM | POA: Insufficient documentation

## 2013-12-05 MED ORDER — OXYCODONE-ACETAMINOPHEN 5-325 MG PO TABS: 1.0000 | ORAL_TABLET | ORAL | Status: DC | PRN

## 2013-12-05 NOTE — Progress Notes (Signed)
Subjective:    Patient ID: [name], female    DOB: [birth year], 80 y.o.   MRN: [id number]  HPI: [name] is a 80 year old female who returns for follow up for chronic pain and medication refill. She says her pain is in her bilateral shoulders and right knee also states it's located all over her body. She rates her pain 8. Her current exercise regime is walking in her home from the bathroom to the kitchen and walking from the bedroom to the bathroom states her daughter.  Her sister passed a week ago and she has been a little down. Emotional support given.   Pain Inventory Average Pain 8 Pain Right Now 8 My pain is sharp, burning, dull, stabbing and aching  In the last 24 hours, has pain interfered with the following? General activity 10 Relation with others 10 Enjoyment of life 10 What TIME of day is your pain at its worst? constant all day Sleep (in general) Fair  Pain is worse with: walking, bending, sitting, inactivity and standing Pain improves with: rest, heat/ice and medication Relief from Meds: 7  Mobility walk with assistance use a walker Do you have any goals in this area?  no  Function Do you have any goals in this area?  no  Neuro/Psych No problems in this area  Prior Studies Any changes since last visit?  no  Physicians involved in your care Any changes since last visit?  no   Family History  Problem Relation Age of Onset  . Diabetes Sister   . Heart disease Sister    History   Social History  . Marital Status: Widowed    Spouse Name: N/A    Number of Children: N/A  . Years of Education: N/A   Social History Main Topics  . Smoking status: Never Smoker   . Smokeless tobacco: Never Used  . Alcohol Use: No  . Drug Use: No  . Sexual Activity: None   Other Topics Concern  . None   Social History Narrative  . None   Past Surgical History  Procedure Laterality Date  . Breast lumpectomy    . Knee surgery    . Breast lumpectomy      Past Medical History  Diagnosis Date  . Hypertension   . Atrial fibrillation    BP 151/88  Pulse 81  Resp 16  Ht 4\' 8"  (1.422 m)  Wt 115 lb (52.164 kg)  BMI 25.80 kg/m2  SpO2 95%  Opioid Risk Score:   Fall Risk Score: Moderate Fall Risk (6-13 points) (pt educated, declined handout)    Review of Systems  Musculoskeletal: Positive for back pain and neck pain.  All other systems reviewed and are negative.      Objective:   Physical Exam  Nursing note and vitals reviewed. Constitutional: She is oriented to person, place, and time. She appears well-developed and well-nourished.  HENT:  Head: Normocephalic.  Neck: Normal range of motion. Neck supple.  Cardiovascular: Normal rate.   Pulmonary/Chest: Effort normal and breath sounds normal.  Musculoskeletal: She exhibits edema.  Normal Muscle Bulk and Muscle testing Reveals: Upper Extremities: Decreased ROM 45 Degrees and Muscle Strength 5/5 Lower Extremities: Full ROM and Muscle strength 5/5 Right Leg Flexion Produces Pain into Patella. No swelling or tenderness noted Lower Extremities With Pitting Edema 2 + Walks with walker Arises from chair with ease Narrow based Gait   Neurological: She is alert and oriented to person, place,  and time.  Skin: Skin is warm and dry.  Psychiatric: She has a normal mood and affect.          Assessment & Plan:  1. Lumbar spinal stenosis and Chronic low back pain.  Refilled: oxyCODONE 5/325 mg One tablet every 4 hours as needed not more than five a day. #150  2. Cervical spondylosis with extremely limited range of motion: Continue Current Medication Regime  3. Shoulder osteoarthritis with contracture: Continue Current Medication regime.   85minutes of face to face patient care time was spent during this visit. All questions were encouraged and answered.   F/U in 1 month

## 2014-01-03 ENCOUNTER — Encounter: Payer: Medicare Other | Attending: Physical Medicine & Rehabilitation | Admitting: Registered Nurse

## 2014-01-03 ENCOUNTER — Encounter: Payer: Self-pay | Admitting: Registered Nurse

## 2014-01-03 VITALS — BP 168/88 | HR 93 | Resp 14 | Wt 113.0 lb

## 2014-01-03 DIAGNOSIS — M19011 Primary osteoarthritis, right shoulder: Secondary | ICD-10-CM | POA: Diagnosis present

## 2014-01-03 DIAGNOSIS — M19012 Primary osteoarthritis, left shoulder: Secondary | ICD-10-CM | POA: Insufficient documentation

## 2014-01-03 DIAGNOSIS — Z79899 Other long term (current) drug therapy: Secondary | ICD-10-CM | POA: Insufficient documentation

## 2014-01-03 DIAGNOSIS — M2550 Pain in unspecified joint: Secondary | ICD-10-CM | POA: Insufficient documentation

## 2014-01-03 DIAGNOSIS — M47812 Spondylosis without myelopathy or radiculopathy, cervical region: Secondary | ICD-10-CM | POA: Diagnosis not present

## 2014-01-03 DIAGNOSIS — Z5181 Encounter for therapeutic drug level monitoring: Secondary | ICD-10-CM | POA: Diagnosis present

## 2014-01-03 DIAGNOSIS — M48062 Spinal stenosis, lumbar region with neurogenic claudication: Secondary | ICD-10-CM

## 2014-01-03 DIAGNOSIS — M4806 Spinal stenosis, lumbar region: Secondary | ICD-10-CM | POA: Insufficient documentation

## 2014-01-03 MED ORDER — OXYCODONE-ACETAMINOPHEN 5-325 MG PO TABS: 1.0000 | ORAL_TABLET | ORAL | Status: DC | PRN

## 2014-01-03 NOTE — Progress Notes (Signed)
Subjective:    Patient ID: [name], female    DOB: 02/01/1918, 80 y.o.   MRN: [id number]  HPI: [name] is a 80 year old female who returns for follow up for chronic pain and medication refill. She says her pain is in her bilateral shoulders, right knee and states it's located all over her body. She rates her pain 8. Her current exercise regime is walking in her home from the bathroom to the kitchen and walking from the bedroom to the bathroom. Daughter in room all questions answered.  Pain Inventory Average Pain 8 Pain Right Now 8 My pain is sharp, burning, dull, stabbing and aching  In the last 24 hours, has pain interfered with the following? General activity 10 Relation with others 10 Enjoyment of life 10 What TIME of day is your pain at its worst? all Sleep (in general) Fair  Pain is worse with: walking, bending, sitting, inactivity and standing Pain improves with: rest, heat/ice and medication Relief from Meds: 7  Mobility walk with assistance use a walker how many minutes can you walk? <5 ability to climb steps?  no do you drive?  no use a wheelchair needs help with transfers  Function Do you have any goals in this area?  no  Neuro/Psych trouble walking  Prior Studies Any changes since last visit?  no  Physicians involved in your care Any changes since last visit?  no   Family History  Problem Relation Age of Onset  . Diabetes Sister   . Heart disease Sister    History   Social History  . Marital Status: Widowed    Spouse Name: N/A    Number of Children: N/A  . Years of Education: N/A   Social History Main Topics  . Smoking status: Never Smoker   . Smokeless tobacco: Never Used  . Alcohol Use: No  . Drug Use: No  . Sexual Activity: None   Other Topics Concern  . None   Social History Narrative  . None   Past Surgical History  Procedure Laterality Date  . Breast lumpectomy    . Knee surgery    . Breast lumpectomy      Past Medical History  Diagnosis Date  . Hypertension   . Atrial fibrillation    BP 168/88  Pulse 93  Resp 14  Wt 113 lb (51.256 kg)  SpO2 98%  Opioid Risk Score:   Fall Risk Score: Moderate Fall Risk (6-13 points) (previoulsy educated and given handout)  Review of Systems  All other systems reviewed and are negative.      Objective:   Physical Exam  Nursing note and vitals reviewed. Constitutional: She is oriented to person, place, and time. She appears well-developed and well-nourished.  HENT:  Head: Normocephalic and atraumatic.  Neck: Normal range of motion. Neck supple.  Cardiovascular:  [id number]  Pulmonary/Chest: Effort normal.  Left Base with crackles noted/ will be taking her Lasix when she goes home  Musculoskeletal:  Normal Muscle Bulk and Muscle Testing Reveals: Upper Extremities: Decreased ROM 45 Degrees and Muscle Strength 5/5 Lower Extremities: Full ROM and Muscle Strength 5/5 Right Leg Flexion Produces Pain into Patella Arises from chair with ease Using Walker for support  Neurological: She is alert and oriented to person, place, and time.  Skin: Skin is warm and dry.  Psychiatric: She has a normal mood and affect.          Assessment & Plan:  1.  Lumbar spinal stenosis and Chronic low back pain.  Refilled: oxyCODONE 5/325 mg One tablet every 4 hours as needed not more than five a day. #150  2. Cervical spondylosis with extremely limited range of motion: Continue Current Medication Regime  3. Shoulder osteoarthritis with contracture: Continue Current Medication regime.  56minutes of face to face patient care time was spent during this visit. All questions were encouraged and answered.   F/U in 1 month

## 2014-02-05 ENCOUNTER — Encounter: Payer: Medicare Other | Attending: Physical Medicine & Rehabilitation | Admitting: Registered Nurse

## 2014-02-05 ENCOUNTER — Encounter: Payer: Self-pay | Admitting: Registered Nurse

## 2014-02-05 VITALS — BP 154/72 | HR 79 | Resp 14 | Ht 60.0 in | Wt 116.0 lb

## 2014-02-05 DIAGNOSIS — Z79899 Other long term (current) drug therapy: Secondary | ICD-10-CM | POA: Insufficient documentation

## 2014-02-05 DIAGNOSIS — M2550 Pain in unspecified joint: Secondary | ICD-10-CM | POA: Insufficient documentation

## 2014-02-05 DIAGNOSIS — M19012 Primary osteoarthritis, left shoulder: Secondary | ICD-10-CM | POA: Insufficient documentation

## 2014-02-05 DIAGNOSIS — M47812 Spondylosis without myelopathy or radiculopathy, cervical region: Secondary | ICD-10-CM | POA: Diagnosis present

## 2014-02-05 DIAGNOSIS — M4806 Spinal stenosis, lumbar region: Secondary | ICD-10-CM | POA: Diagnosis present

## 2014-02-05 DIAGNOSIS — M19011 Primary osteoarthritis, right shoulder: Secondary | ICD-10-CM | POA: Diagnosis present

## 2014-02-05 DIAGNOSIS — M48062 Spinal stenosis, lumbar region with neurogenic claudication: Secondary | ICD-10-CM

## 2014-02-05 DIAGNOSIS — Z5181 Encounter for therapeutic drug level monitoring: Secondary | ICD-10-CM | POA: Insufficient documentation

## 2014-02-05 MED ORDER — OXYCODONE-ACETAMINOPHEN 5-325 MG PO TABS: 1.0000 | ORAL_TABLET | ORAL | Status: DC | PRN

## 2014-02-05 NOTE — Progress Notes (Signed)
Subjective:    Patient ID: [name], female    DOB: 07/22/1917, 80 y.o.   MRN: [id number]  HPI: [name] is a 80 year old female who returns for follow up for chronic pain and medication refill. She says her pain is  located all over her body. She rates her pain 8. Her current exercise regime is walking in her home from the bathroom to the kitchen and walking from the bedroom to the bathroom. Daughter in room all questions answered.  Pain Inventory Average Pain 8 Pain Right Now 8 My pain is constant, sharp, burning, dull, stabbing, tingling and aching  In the last 24 hours, has pain interfered with the following? General activity 10 Relation with others 10 Enjoyment of life 10 What TIME of day is your pain at its worst? all the time Sleep (in general) Good  Pain is worse with: walking, bending, sitting, inactivity and standing Pain improves with: rest, heat/ice and medication Relief from Meds: 8  Mobility walk with assistance use a walker how many minutes can you walk? 3-4 ability to climb steps?  no do you drive?  no Do you have any goals in this area?  no  Function retired Do you have any goals in this area?  no  Neuro/Psych No problems in this area  Prior Studies Any changes since last visit?  no  Physicians involved in your care Any changes since last visit?  no   Family History  Problem Relation Age of Onset  . Diabetes Sister   . Heart disease Sister    History   Social History  . Marital Status: Widowed    Spouse Name: N/A    Number of Children: N/A  . Years of Education: N/A   Social History Main Topics  . Smoking status: Never Smoker   . Smokeless tobacco: Never Used  . Alcohol Use: No  . Drug Use: No  . Sexual Activity: None   Other Topics Concern  . None   Social History Narrative   Past Surgical History  Procedure Laterality Date  . Breast lumpectomy    . Knee surgery    . Breast lumpectomy     Past Medical  History  Diagnosis Date  . Hypertension   . Atrial fibrillation    Pulse 79  Resp 14  Ht 5' (1.524 m)  Wt 116 lb (52.617 kg)  BMI 22.65 kg/m2  SpO2 95%  Opioid Risk Score:   Fall Risk Score: Moderate Fall Risk (6-13 points)  Review of Systems     Objective:   Physical Exam  Constitutional: She is oriented to person, place, and time. She appears well-developed and well-nourished.  HENT:  Head: Normocephalic and atraumatic.  Neck: Normal range of motion. Neck supple.  Cardiovascular: Normal rate and regular rhythm.   Pulmonary/Chest: Effort normal and breath sounds normal.  Musculoskeletal:  Normal Muscle Bulk and Muscle testing Reveals: Upper Extremities: Decreased ROM 45 Degreesand Muscle Strength 5/5 Lower extremities: Full ROM and Muscle strength 5/5 Right Lower extremity Flexion Produces pain into Patella Arises from chair with ease Narrow based gait/ Using Walker  Neurological: She is alert and oriented to person, place, and time.  Skin: Skin is warm and dry.  Psychiatric: She has a normal mood and affect.  Nursing note and vitals reviewed.         Assessment & Plan:  1. Lumbar spinal stenosis and Chronic low back pain.  Refilled: oxyCODONE 5/325 mg One  tablet every 4 hours as needed not more than five a day. #150  2. Cervical spondylosis with extremely limited range of motion: Continue Current Medication Regime  3. Shoulder osteoarthritis with contracture: Continue Current Medication regime.  43minutes of face to face patient care time was spent during this visit. All questions were encouraged and answered.   F/U in 1 month

## 2014-03-05 ENCOUNTER — Encounter: Payer: Medicare Other | Attending: Physical Medicine & Rehabilitation | Admitting: Registered Nurse

## 2014-03-05 ENCOUNTER — Encounter: Payer: Self-pay | Admitting: Registered Nurse

## 2014-03-05 VITALS — BP 168/100 | HR 63 | Resp 14 | Wt 110.4 lb

## 2014-03-05 DIAGNOSIS — Z5181 Encounter for therapeutic drug level monitoring: Secondary | ICD-10-CM

## 2014-03-05 DIAGNOSIS — M47812 Spondylosis without myelopathy or radiculopathy, cervical region: Secondary | ICD-10-CM

## 2014-03-05 DIAGNOSIS — Z79899 Other long term (current) drug therapy: Secondary | ICD-10-CM

## 2014-03-05 DIAGNOSIS — M2550 Pain in unspecified joint: Secondary | ICD-10-CM

## 2014-03-05 DIAGNOSIS — M19011 Primary osteoarthritis, right shoulder: Secondary | ICD-10-CM | POA: Diagnosis present

## 2014-03-05 DIAGNOSIS — M4806 Spinal stenosis, lumbar region: Secondary | ICD-10-CM

## 2014-03-05 DIAGNOSIS — M19012 Primary osteoarthritis, left shoulder: Secondary | ICD-10-CM | POA: Diagnosis present

## 2014-03-05 DIAGNOSIS — M48062 Spinal stenosis, lumbar region with neurogenic claudication: Secondary | ICD-10-CM

## 2014-03-05 MED ORDER — OXYCODONE-ACETAMINOPHEN 5-325 MG PO TABS: 1.0000 | ORAL_TABLET | ORAL | Status: DC | PRN

## 2014-03-05 NOTE — Progress Notes (Signed)
Subjective:    Patient ID: [name], female    DOB: 1917-10-18, 80 y.o.   MRN: [id number]  HPI: [name] is a 80 year old female who returns for follow up for chronic pain and medication refill. She says her pain is located all over her body, especially her left arm. She rates her pain 8. Her current exercise regime is walking in her home from the bathroom to the kitchen and walking from the bedroom to the bathroom. Daughter in room all questions answered. She arrived to office hypertensive 168/100, blood pressure re-checked 150/87. Daughter stated mother has a bruised on her left wrist she and her daughter was trying to help her up the stairs of her grand-daughter house. Ms. Paternostro denies falling or bumping against wall. Pain Inventory Average Pain 8 Pain Right Now 8 My pain is sharp, burning, dull, stabbing, tingling and aching  In the last 24 hours, has pain interfered with the following? General activity 10 Relation with others 10 Enjoyment of life 10 What TIME of day is your pain at its worst? all Sleep (in general) Good  Pain is worse with: walking, bending, sitting, inactivity and standing Pain improves with: rest, heat/ice and medication Relief from Meds: 8  Mobility use a cane use a walker how many minutes can you walk? 4 ability to climb steps?  no do you drive?  no  Function I need assistance with the following:  dressing, bathing, meal prep, household duties and shopping  Neuro/Psych trouble walking  Prior Studies Any changes since last visit?  no  Physicians involved in your care Any changes since last visit?  no   Family History  Problem Relation Age of Onset  . Diabetes Sister   . Heart disease Sister    History   Social History  . Marital Status: Widowed    Spouse Name: N/A    Number of Children: N/A  . Years of Education: N/A   Social History Main Topics  . Smoking status: Never Smoker   . Smokeless tobacco: Never Used  .  Alcohol Use: No  . Drug Use: No  . Sexual Activity: None   Other Topics Concern  . None   Social History Narrative   Past Surgical History  Procedure Laterality Date  . Breast lumpectomy    . Knee surgery    . Breast lumpectomy     Past Medical History  Diagnosis Date  . Hypertension   . Atrial fibrillation    BP 168/100 mmHg  Pulse 63  Resp 14  Wt 110 lb 6.4 oz (50.077 kg)  SpO2 97%  Opioid Risk Score:   Fall Risk Score: Moderate Fall Risk (6-13 points) (previously educated and given  handout)   Review of Systems  Musculoskeletal: Positive for back pain and gait problem.       Knee pain  All other systems reviewed and are negative.      Objective:   Physical Exam  Constitutional: She is oriented to person, place, and time. She appears well-developed and well-nourished.  HENT:  Head: Normocephalic and atraumatic.  Neck: Normal range of motion. Neck supple.  Cardiovascular: Normal rate and regular rhythm.   Pulmonary/Chest: Effort normal and breath sounds normal.  Musculoskeletal:  Normal Muscle Bulk and Muscle testing Reveals: Upper Extremities: Decreased ROM and Muscle strength 4/5/ Right 45 degrees and left 30 degrees. Left AC Joint and Deltoid Tenderness Left wrist with ecchymosis noted fading Lower Extremities: Full ROM and  Muscle strength 5/5 Right Leg Flexion Produces pain into Patella Arises from chair with ease using walker for support Narrow Based Gait  Neurological: She is alert and oriented to person, place, and time.  Skin: Skin is warm and dry.  Psychiatric: She has a normal mood and affect.  Nursing note and vitals reviewed.         Assessment & Plan:  1. Lumbar spinal stenosis and Chronic low back pain.  Refilled: oxyCODONE 5/325 mg One tablet every 4 hours as needed not more than five a day. #150  2. Cervical spondylosis with extremely limited range of motion: Continue Current Medication Regime  3. Shoulder osteoarthritis with  contracture: Continue Current Medication regime.   39minutes of face to face patient care time was spent during this visit. All questions were encouraged and answered.   F/U in 1 month

## 2014-04-02 ENCOUNTER — Encounter: Payer: Self-pay | Admitting: Registered Nurse

## 2014-04-02 ENCOUNTER — Encounter: Payer: Medicare Other | Attending: Physical Medicine & Rehabilitation | Admitting: Registered Nurse

## 2014-04-02 ENCOUNTER — Other Ambulatory Visit: Payer: Self-pay | Admitting: Physical Medicine & Rehabilitation

## 2014-04-02 VITALS — BP 181/111 | HR 57 | Resp 14

## 2014-04-02 DIAGNOSIS — Z79899 Other long term (current) drug therapy: Secondary | ICD-10-CM | POA: Insufficient documentation

## 2014-04-02 DIAGNOSIS — M4806 Spinal stenosis, lumbar region: Secondary | ICD-10-CM | POA: Diagnosis present

## 2014-04-02 DIAGNOSIS — Z5181 Encounter for therapeutic drug level monitoring: Secondary | ICD-10-CM | POA: Diagnosis present

## 2014-04-02 DIAGNOSIS — M47812 Spondylosis without myelopathy or radiculopathy, cervical region: Secondary | ICD-10-CM | POA: Insufficient documentation

## 2014-04-02 DIAGNOSIS — M19012 Primary osteoarthritis, left shoulder: Secondary | ICD-10-CM

## 2014-04-02 DIAGNOSIS — M2550 Pain in unspecified joint: Secondary | ICD-10-CM | POA: Diagnosis present

## 2014-04-02 DIAGNOSIS — M19011 Primary osteoarthritis, right shoulder: Secondary | ICD-10-CM | POA: Diagnosis present

## 2014-04-02 DIAGNOSIS — G894 Chronic pain syndrome: Secondary | ICD-10-CM

## 2014-04-02 DIAGNOSIS — M48062 Spinal stenosis, lumbar region with neurogenic claudication: Secondary | ICD-10-CM

## 2014-04-02 MED ORDER — OXYCODONE-ACETAMINOPHEN 5-325 MG PO TABS: 1.0000 | ORAL_TABLET | ORAL | Status: DC | PRN

## 2014-04-02 NOTE — Progress Notes (Signed)
Subjective:    Patient ID: [name], female    DOB: Oct 05, 1917, 81 y.o.   MRN: [id number]  HPI: [name] is a 81 year old female who returns for follow up for chronic pain and medication refill. She says her pain is located all over her body, especially her left arm and right knee. She rates her pain 8. Her current exercise regime is walking in her home from the bathroom to the kitchen and walking from the bedroom to the bathroom. Her daughter car broke down this morning, the daughter had to walk to her home to get her other car. The new car didn't have the handicap sticker so [name] had to walk longer distance. She arrived to clinic hypertensive, blood pressure re-checked 182/85, her daughter states she had her medications earlier this morning. They will rest when they go home, they refused to go to ED at this time. [name] escorted to lobby with staff, daughter picked her up via front door. Pain Inventory Average Pain 9 Pain Right Now 8 My pain is sharp, burning, dull, stabbing, tingling, aching and other  In the last 24 hours, has pain interfered with the following? General activity 10 Relation with others 10 Enjoyment of life 10 What TIME of day is your pain at its worst? all Sleep (in general) Fair  Pain is worse with: walking, bending, sitting, inactivity, standing and some activites Pain improves with: rest, heat/ice, medication and TENS Relief from Meds: 8  Mobility walk with assistance use a walker how many minutes can you walk? 3 ability to climb steps?  no do you drive?  no Do you have any goals in this area?  no  Function Do you have any goals in this area?  no  Neuro/Psych No problems in this area  Prior Studies Any changes since last visit?  no  Physicians involved in your care Any changes since last visit?  no   Family History  Problem Relation Age of Onset  . Diabetes Sister   . Heart disease Sister    History   Social History    . Marital Status: Widowed    Spouse Name: N/A    Number of Children: N/A  . Years of Education: N/A   Social History Main Topics  . Smoking status: Never Smoker   . Smokeless tobacco: Never Used  . Alcohol Use: No  . Drug Use: No  . Sexual Activity: None   Other Topics Concern  . None   Social History Narrative   Past Surgical History  Procedure Laterality Date  . Breast lumpectomy    . Knee surgery    . Breast lumpectomy     Past Medical History  Diagnosis Date  . Hypertension   . Atrial fibrillation    BP 181/111 mmHg  Pulse 57  Resp 14  SpO2 97%  Opioid Risk Score:   Fall Risk Score: Moderate Fall Risk (6-13 points) Review of Systems  All other systems reviewed and are negative.      Objective:   Physical Exam  Constitutional: She is oriented to person, place, and time. She appears well-developed and well-nourished.  HENT:  Head: Normocephalic and atraumatic.  Neck: Normal range of motion. Neck supple.  Cardiovascular:  IRR  Pulmonary/Chest: Effort normal and breath sounds normal.  Musculoskeletal:  Normal Muscle Bulk and Muscle testing Reveals: Upper extremities: Decreased ROm Right 30 Degrees and Left 20 Degrees Muscle strength 5/5 Lower extremities: Full ROM  and Muscle Strength 5/5 Right L:eg Flexion Produces pain into Patella Arises from chair with easeNarrow Based Gait/ Using Walker  Neurological: She is alert and oriented to person, place, and time.  Skin: Skin is warm and dry.  Psychiatric: She has a normal mood and affect.  Nursing note and vitals reviewed.         Assessment & Plan:  1. Lumbar spinal stenosis and Chronic low back pain.  Refilled: oxyCODONE 5/325 mg One tablet every 4 hours as needed not more than five a day. #150  2. Cervical spondylosis with extremely limited range of motion: Continue Current Medication Regime  3. Shoulder osteoarthritis with contracture: Continue Current Medication regime.   65minutes of face  to face patient care time was spent during this visit. All questions were encouraged and answered.   F/U in 1 month

## 2014-04-03 LAB — PMP ALCOHOL METABOLITE (ETG): ETGU: NEGATIVE ng/mL

## 2014-04-06 LAB — OXYCODONE, URINE (LC/MS-MS)
Noroxycodone, Ur: 1236 ng/mL (ref <50)
OXYCODONE, UR: 605 ng/mL (ref <50)
Oxymorphone: 1273 ng/mL (ref <50)

## 2014-04-06 LAB — OPIATES/OPIOIDS (LC/MS-MS)
Codeine Urine: NEGATIVE ng/mL (ref <50)
HYDROMORPHONE: NEGATIVE ng/mL (ref <50)
Hydrocodone: NEGATIVE ng/mL (ref <50)
Morphine Urine: NEGATIVE ng/mL (ref <50)
NOROXYCODONE, UR: 1236 ng/mL (ref <50)
Norhydrocodone, Ur: NEGATIVE ng/mL (ref <50)
OXYCODONE, UR: 605 ng/mL (ref <50)
OXYMORPHONE, URINE: 1273 ng/mL (ref <50)

## 2014-04-06 LAB — PRESCRIPTION MONITORING PROFILE (SOLSTAS)
Amphetamine/Meth: NEGATIVE ng/mL
Barbiturate Screen, Urine: NEGATIVE ng/mL
Benzodiazepine Screen, Urine: NEGATIVE ng/mL
Buprenorphine, Urine: NEGATIVE ng/mL
CARISOPRODOL, URINE: NEGATIVE ng/mL
Cannabinoid Scrn, Ur: NEGATIVE ng/mL
Cocaine Metabolites: NEGATIVE ng/mL
Creatinine, Urine: 49.36 mg/dL (ref >20.0)
ECSTASY: NEGATIVE ng/mL
Fentanyl, Ur: NEGATIVE ng/mL
Meperidine, Ur: NEGATIVE ng/mL
Methadone Screen, Urine: NEGATIVE ng/mL
Nitrites, Initial: NEGATIVE ug/mL
PH URINE, INITIAL: 6.9 pH (ref 4.5–8.9)
Propoxyphene: NEGATIVE ng/mL
TAPENTADOLUR: NEGATIVE ng/mL
Tramadol Scrn, Ur: NEGATIVE ng/mL
Zolpidem, Urine: NEGATIVE ng/mL

## 2014-04-09 ENCOUNTER — Ambulatory Visit: Payer: Medicare Other | Admitting: Registered Nurse

## 2014-04-09 NOTE — Progress Notes (Signed)
Urine drug screen for this encounter is consistent for prescribed medications.   

## 2014-04-30 ENCOUNTER — Ambulatory Visit: Payer: Medicare Other | Admitting: Registered Nurse

## 2014-05-07 ENCOUNTER — Encounter: Payer: Medicare Other | Attending: Physical Medicine & Rehabilitation | Admitting: Registered Nurse

## 2014-05-07 ENCOUNTER — Encounter: Payer: Self-pay | Admitting: Registered Nurse

## 2014-05-07 VITALS — BP 181/70 | HR 69 | Resp 14

## 2014-05-07 DIAGNOSIS — M2550 Pain in unspecified joint: Secondary | ICD-10-CM

## 2014-05-07 DIAGNOSIS — Z5181 Encounter for therapeutic drug level monitoring: Secondary | ICD-10-CM | POA: Insufficient documentation

## 2014-05-07 DIAGNOSIS — Z79899 Other long term (current) drug therapy: Secondary | ICD-10-CM | POA: Insufficient documentation

## 2014-05-07 DIAGNOSIS — M4806 Spinal stenosis, lumbar region: Secondary | ICD-10-CM | POA: Diagnosis present

## 2014-05-07 DIAGNOSIS — M47812 Spondylosis without myelopathy or radiculopathy, cervical region: Secondary | ICD-10-CM | POA: Diagnosis not present

## 2014-05-07 DIAGNOSIS — M19011 Primary osteoarthritis, right shoulder: Secondary | ICD-10-CM

## 2014-05-07 DIAGNOSIS — M48062 Spinal stenosis, lumbar region with neurogenic claudication: Secondary | ICD-10-CM

## 2014-05-07 DIAGNOSIS — M19012 Primary osteoarthritis, left shoulder: Secondary | ICD-10-CM | POA: Insufficient documentation

## 2014-05-07 DIAGNOSIS — G894 Chronic pain syndrome: Secondary | ICD-10-CM

## 2014-05-07 MED ORDER — OXYCODONE-ACETAMINOPHEN 5-325 MG PO TABS: 1.0000 | ORAL_TABLET | ORAL | Status: DC | PRN

## 2014-05-07 NOTE — Progress Notes (Signed)
Subjective:    Patient ID: [name], female    DOB: 10/19/1917, 81 y.o.   MRN: [id number]  HPI: [name] is a 81 year old female who returns for follow up for chronic pain and medication refill. She says her pain is located all over her body, especially her left arm and right knee. She rates her pain 9. Her current exercise regime is walking in her home from the bathroom to the kitchen and walking from the bedroom to the bathroom. She arrived to office hypertensive 181/70, blood pressure re-checked 183/94. Daughter states she had all her anti-hypertensive's this morning. Encouraged to obtain a blood pressure apparatus, she verbalizes understanding. Daughter states her granddaughter is a Marine scientist she came last month and [name] blood pressure was normal. She will have her grand-daughter come this month and encouraged to follow up with her PCP. She verbalizes understanding.  Pain Inventory Average Pain 9 Pain Right Now 9 My pain is sharp, burning, dull, stabbing, tingling and aching  In the last 24 hours, has pain interfered with the following? General activity 10 Relation with others 10 Enjoyment of life 10 What TIME of day is your pain at its worst? all Sleep (in general) Fair  Pain is worse with: walking, bending, sitting, inactivity and standing Pain improves with: rest, heat/ice and medication Relief from Meds: 8  Mobility walk with assistance use a walker how many minutes can you walk? 3 ability to climb steps?  no do you drive?  no use a wheelchair needs help with transfers Do you have any goals in this area?  no  Function Do you have any goals in this area?  no  Neuro/Psych No problems in this area  Prior Studies Any changes since last visit?  no  Physicians involved in your care Any changes since last visit?  no   Family History  Problem Relation Age of Onset  . Diabetes Sister   . Heart disease Sister    History   Social History  . Marital  Status: Widowed    Spouse Name: N/A    Number of Children: N/A  . Years of Education: N/A   Social History Main Topics  . Smoking status: Never Smoker   . Smokeless tobacco: Never Used  . Alcohol Use: No  . Drug Use: No  . Sexual Activity: None   Other Topics Concern  . None   Social History Narrative   Past Surgical History  Procedure Laterality Date  . Breast lumpectomy    . Knee surgery    . Breast lumpectomy     Past Medical History  Diagnosis Date  . Hypertension   . Atrial fibrillation    BP 181/70 mmHg  Pulse 69  Resp 14  SpO2 95%  Opioid Risk Score:   Fall Risk Score: Moderate Fall Risk (6-13 points) (pt given pamphlet during todays visit) Review of Systems  All other systems reviewed and are negative.      Objective:   Physical Exam  Constitutional: She is oriented to person, place, and time. She appears well-developed and well-nourished.  HENT:  Head: Normocephalic and atraumatic.  Neck: Normal range of motion. Neck supple.  Cardiovascular: Normal rate and regular rhythm.   Pulmonary/Chest: Effort normal and breath sounds normal.  Musculoskeletal:  Normal Muscle Bulk and Muscle Testing Reveals: Upper Extremities: Decreased ROM 45 Degrees and Muscle Strength 5/5 Lumbar Paraspinal Tenderness: L-3- L-5 Lower Extremities: Full ROM and Muscle strength 5/5 Right  Lower extremity Flexion Produces pain into Patella Arrived in wheelchair    Neurological: She is alert and oriented to person, place, and time.  Skin: Skin is warm and dry.  Psychiatric: She has a normal mood and affect.  Nursing note and vitals reviewed.         Assessment & Plan:  1. Lumbar spinal stenosis and Chronic low back pain.  Refilled: oxyCODONE 5/325 mg One tablet every 4 hours as needed not more than five a day. #150  2. Cervical spondylosis with extremely limited range of motion: Continue Current Medication Regime  3. Shoulder osteoarthritis with contracture: Continue  Current Medication regime.   31minutes of face to face patient care time was spent during this visit. All questions were encouraged and answered.   F/U in 1 month

## 2014-06-04 ENCOUNTER — Encounter: Payer: Self-pay | Admitting: Registered Nurse

## 2014-06-04 ENCOUNTER — Encounter: Payer: Medicare Other | Attending: Physical Medicine & Rehabilitation | Admitting: Registered Nurse

## 2014-06-04 VITALS — BP 143/74 | HR 70 | Resp 14

## 2014-06-04 DIAGNOSIS — Z5181 Encounter for therapeutic drug level monitoring: Secondary | ICD-10-CM | POA: Insufficient documentation

## 2014-06-04 DIAGNOSIS — G894 Chronic pain syndrome: Secondary | ICD-10-CM | POA: Diagnosis not present

## 2014-06-04 DIAGNOSIS — Z79899 Other long term (current) drug therapy: Secondary | ICD-10-CM | POA: Insufficient documentation

## 2014-06-04 DIAGNOSIS — M47812 Spondylosis without myelopathy or radiculopathy, cervical region: Secondary | ICD-10-CM | POA: Insufficient documentation

## 2014-06-04 DIAGNOSIS — M19011 Primary osteoarthritis, right shoulder: Secondary | ICD-10-CM | POA: Insufficient documentation

## 2014-06-04 DIAGNOSIS — M19012 Primary osteoarthritis, left shoulder: Secondary | ICD-10-CM | POA: Diagnosis present

## 2014-06-04 DIAGNOSIS — M2550 Pain in unspecified joint: Secondary | ICD-10-CM | POA: Diagnosis present

## 2014-06-04 DIAGNOSIS — M4806 Spinal stenosis, lumbar region: Secondary | ICD-10-CM | POA: Diagnosis present

## 2014-06-04 DIAGNOSIS — M48062 Spinal stenosis, lumbar region with neurogenic claudication: Secondary | ICD-10-CM

## 2014-06-04 MED ORDER — OXYCODONE-ACETAMINOPHEN 5-325 MG PO TABS: 1.0000 | ORAL_TABLET | ORAL | Status: DC | PRN

## 2014-06-04 NOTE — Progress Notes (Signed)
Subjective:    Patient ID: [name], female    DOB: 11/12/1917, 81 y.o.   MRN: [id number]  HPI: [name] is a 81 year old female who returns for follow up for chronic pain and medication refill. She says her pain is located all over her body, especially her left arm and right knee. She rates her pain 8. Her current exercise regime is walking in her home from the bathroom to the kitchen and walking from the bedroom to the bathroom. Daughter in room.  Pain Inventory Average Pain 8 Pain Right Now 8 My pain is constant, sharp, burning, dull, stabbing, tingling and aching  In the last 24 hours, has pain interfered with the following? General activity 10 Relation with others 10 Enjoyment of life 10 What TIME of day is your pain at its worst? all Sleep (in general) Fair  Pain is worse with: walking, bending, sitting, inactivity and standing Pain improves with: rest, heat/ice and medication Relief from Meds: 5  Mobility walk with assistance use a walker how many minutes can you walk? 5 ability to climb steps?  no do you drive?  no use a wheelchair needs help with transfers Do you have any goals in this area?  no  Function Do you have any goals in this area?  no  Neuro/Psych No problems in this area  Prior Studies Any changes since last visit?  no  Physicians involved in your care Any changes since last visit?  no   Family History  Problem Relation Age of Onset  . Diabetes Sister   . Heart disease Sister    History   Social History  . Marital Status: Widowed    Spouse Name: N/A  . Number of Children: N/A  . Years of Education: N/A   Social History Main Topics  . Smoking status: Never Smoker   . Smokeless tobacco: Never Used  . Alcohol Use: No  . Drug Use: No  . Sexual Activity: Not on file   Other Topics Concern  . None   Social History Narrative   Past Surgical History  Procedure Laterality Date  . Breast lumpectomy    . Knee surgery     . Breast lumpectomy     Past Medical History  Diagnosis Date  . Hypertension   . Atrial fibrillation    BP   Pulse 70  Resp 14  SpO2 97%  Opioid Risk Score:   Fall Risk Score: Moderate Fall Risk (6-13 points) Review of Systems  All other systems reviewed and are negative.      Objective:   Physical Exam  Constitutional: She is oriented to person, place, and time. She appears well-developed and well-nourished.  HENT:  Head: Normocephalic and atraumatic.  Neck: Normal range of motion. Neck supple.  Cardiovascular: Normal rate and regular rhythm.   Pulmonary/Chest: Effort normal and breath sounds normal.  Musculoskeletal:  Normal Muscle Bulk and Muscle Testing Reveals: Upper extremities: Decreased ROM 45 Degrees and Muscle Strength 4/5 Lower Extremities: Full ROM and Muscle strength 5/5 Right Lower Extremity Flexion Produces Pain into Patella. Arises from chair with ease/ using walker for support    Neurological: She is alert and oriented to person, place, and time.  Skin: Skin is warm and dry.  Psychiatric: She has a normal mood and affect.  Nursing note and vitals reviewed.         Assessment & Plan:  1. Lumbar spinal stenosis and Chronic low back pain.  Refilled: oxyCODONE 5/325 mg One tablet every 4 hours as needed not more than five a day. #150  2. Cervical spondylosis with extremely limited range of motion: Continue Current Medication Regime  3. Shoulder osteoarthritis with contracture: Continue Current Medication regime.   61minutes of face to face patient care time was spent during this visit. All questions were encouraged and answered.   F/U in 1 month

## 2014-07-03 ENCOUNTER — Encounter: Payer: Medicare Other | Attending: Physical Medicine & Rehabilitation | Admitting: Registered Nurse

## 2014-07-03 ENCOUNTER — Encounter: Payer: Self-pay | Admitting: Registered Nurse

## 2014-07-03 VITALS — BP 160/88 | HR 70 | Resp 14

## 2014-07-03 DIAGNOSIS — M19011 Primary osteoarthritis, right shoulder: Secondary | ICD-10-CM | POA: Insufficient documentation

## 2014-07-03 DIAGNOSIS — Z79899 Other long term (current) drug therapy: Secondary | ICD-10-CM | POA: Diagnosis present

## 2014-07-03 DIAGNOSIS — G894 Chronic pain syndrome: Secondary | ICD-10-CM | POA: Diagnosis not present

## 2014-07-03 DIAGNOSIS — M19012 Primary osteoarthritis, left shoulder: Secondary | ICD-10-CM | POA: Diagnosis present

## 2014-07-03 DIAGNOSIS — M2550 Pain in unspecified joint: Secondary | ICD-10-CM | POA: Diagnosis present

## 2014-07-03 DIAGNOSIS — M47812 Spondylosis without myelopathy or radiculopathy, cervical region: Secondary | ICD-10-CM | POA: Diagnosis not present

## 2014-07-03 DIAGNOSIS — M4806 Spinal stenosis, lumbar region: Secondary | ICD-10-CM | POA: Diagnosis present

## 2014-07-03 DIAGNOSIS — M48062 Spinal stenosis, lumbar region with neurogenic claudication: Secondary | ICD-10-CM

## 2014-07-03 DIAGNOSIS — Z5181 Encounter for therapeutic drug level monitoring: Secondary | ICD-10-CM

## 2014-07-03 MED ORDER — OXYCODONE-ACETAMINOPHEN 5-325 MG PO TABS: 1.0000 | ORAL_TABLET | ORAL | Status: DC | PRN

## 2014-07-03 NOTE — Progress Notes (Signed)
Subjective:    Patient ID: [name], female    DOB: 1918-01-02, 81 y.o.   MRN: [id number]  HPI: [name] is a 81 year old female who returns for follow up for chronic pain and medication refill. She says her pain is located in her left shoulder and right knee also states she has pain all over her body.She rates her pain 8. Her current exercise regime is walking in her home from the bathroom to the kitchen and walking from the bedroom to the bathroom. Daughter in room.   Pain Inventory Average Pain 8 Pain Right Now 8 My pain is sharp, burning, dull, stabbing, tingling and aching  In the last 24 hours, has pain interfered with the following? General activity 10 Relation with others 10 Enjoyment of life 10 What TIME of day is your pain at its worst? all Sleep (in general) Good  Pain is worse with: walking, bending, sitting, inactivity and standing Pain improves with: rest, heat/ice and medication Relief from Meds: 8  Mobility walk with assistance use a walker how many minutes can you walk? 5 ability to climb steps?  no do you drive?  no Do you have any goals in this area?  no  Function Do you have any goals in this area?  no  Neuro/Psych No problems in this area  Prior Studies Any changes since last visit?  no  Physicians involved in your care Any changes since last visit?  no   Family History  Problem Relation Age of Onset  . Diabetes Sister   . Heart disease Sister    History   Social History  . Marital Status: Widowed    Spouse Name: N/A  . Number of Children: N/A  . Years of Education: N/A   Social History Main Topics  . Smoking status: Never Smoker   . Smokeless tobacco: Never Used  . Alcohol Use: No  . Drug Use: No  . Sexual Activity: Not on file   Other Topics Concern  . None   Social History Narrative   Past Surgical History  Procedure Laterality Date  . Breast lumpectomy    . Knee surgery    . Breast lumpectomy      Past Medical History  Diagnosis Date  . Hypertension   . Atrial fibrillation    BP 160/88 mmHg  Pulse 70  Resp 14  SpO2 97%  Opioid Risk Score:   Fall Risk Score: Moderate Fall Risk (6-13 points) (patient previously educated)`1  Depression screen PHQ 2/9  Depression screen PHQ 2/9 07/03/2014  Decreased Interest 3  Down, Depressed, Hopeless 0  PHQ - 2 Score 3  Altered sleeping 0  Tired, decreased energy 3  Change in appetite 3  Feeling bad or failure about yourself  0  Trouble concentrating 2  Moving slowly or fidgety/restless 0  Suicidal thoughts 0  PHQ-9 Score 11     Review of Systems  All other systems reviewed and are negative.      Objective:   Physical Exam  Constitutional: She is oriented to person, place, and time. She appears well-developed and well-nourished.  HENT:  Head: Normocephalic and atraumatic.  Neck: Normal range of motion. Neck supple.  Cardiovascular: Normal rate and regular rhythm.   Pulmonary/Chest: Effort normal and breath sounds normal.  Musculoskeletal:  Normal Muscle Bulk and Muscle Testing Reveals: Upper Extremities: Decreased ROM 45 Degrees and Muscle Strength 4/5 Lower Extremities: Full ROM and Muscle strength 5/5 Right Lower  Extremity Flexion Produces pain into Patella Arises from chair with ease/ Using walker for support    Neurological: She is alert and oriented to person, place, and time.  Skin: Skin is warm and dry.  Psychiatric: She has a normal mood and affect.  Nursing note and vitals reviewed.         Assessment & Plan:  1. Lumbar spinal stenosis and Chronic low back pain.  Refilled: oxyCODONE 5/325 mg One tablet every 4 hours as needed not more than five a day. #150  2. Cervical spondylosis with extremely limited range of motion: Continue Current Medication Regime  3. Shoulder osteoarthritis with contracture: Continue Current Medication regime.   21minutes of face to face patient care time was spent during  this visit. All questions were encouraged and answered.   F/U in 1 month

## 2014-07-31 ENCOUNTER — Encounter: Payer: Medicare Other | Attending: Physical Medicine & Rehabilitation

## 2014-07-31 ENCOUNTER — Ambulatory Visit (HOSPITAL_BASED_OUTPATIENT_CLINIC_OR_DEPARTMENT_OTHER): Payer: Medicare Other | Admitting: Physical Medicine & Rehabilitation

## 2014-07-31 ENCOUNTER — Encounter: Payer: Self-pay | Admitting: Physical Medicine & Rehabilitation

## 2014-07-31 ENCOUNTER — Other Ambulatory Visit: Payer: Self-pay | Admitting: Physical Medicine & Rehabilitation

## 2014-07-31 VITALS — BP 175/103 | HR 71 | Resp 14

## 2014-07-31 DIAGNOSIS — M4806 Spinal stenosis, lumbar region: Secondary | ICD-10-CM | POA: Insufficient documentation

## 2014-07-31 DIAGNOSIS — M24512 Contracture, left shoulder: Secondary | ICD-10-CM

## 2014-07-31 DIAGNOSIS — M47812 Spondylosis without myelopathy or radiculopathy, cervical region: Secondary | ICD-10-CM | POA: Insufficient documentation

## 2014-07-31 DIAGNOSIS — M19011 Primary osteoarthritis, right shoulder: Secondary | ICD-10-CM

## 2014-07-31 DIAGNOSIS — G894 Chronic pain syndrome: Secondary | ICD-10-CM | POA: Diagnosis not present

## 2014-07-31 DIAGNOSIS — M179 Osteoarthritis of knee, unspecified: Secondary | ICD-10-CM | POA: Diagnosis not present

## 2014-07-31 DIAGNOSIS — M24519 Contracture, unspecified shoulder: Secondary | ICD-10-CM | POA: Insufficient documentation

## 2014-07-31 DIAGNOSIS — M1711 Unilateral primary osteoarthritis, right knee: Secondary | ICD-10-CM

## 2014-07-31 DIAGNOSIS — M19012 Primary osteoarthritis, left shoulder: Secondary | ICD-10-CM

## 2014-07-31 DIAGNOSIS — Z79899 Other long term (current) drug therapy: Secondary | ICD-10-CM

## 2014-07-31 DIAGNOSIS — Z5181 Encounter for therapeutic drug level monitoring: Secondary | ICD-10-CM | POA: Diagnosis not present

## 2014-07-31 DIAGNOSIS — M2550 Pain in unspecified joint: Secondary | ICD-10-CM | POA: Insufficient documentation

## 2014-07-31 MED ORDER — OXYCODONE-ACETAMINOPHEN 5-325 MG PO TABS: 1.0000 | ORAL_TABLET | ORAL | Status: DC | PRN

## 2014-07-31 NOTE — Patient Instructions (Signed)
Continue walking as much as possible

## 2014-07-31 NOTE — Progress Notes (Signed)
Subjective:    Patient ID: [name], female    DOB: 1917-06-19, 80 y.o.   MRN: [id number]  HPI Takes pain medicine every 4 hours when awake, sleeps throught night Senokot 3 in am and 2 qhs  R>L knee pain L>R shoulder pain  Needs assist with pulling up pants and panties Takes sponge bath at sink Needs help with meals  Needs help with shoes and socks Amb with walker  PSH Left knee TKR  Pain Inventory Average Pain 8 Pain Right Now 8 My pain is sharp, burning, dull, stabbing, tingling and aching  In the last 24 hours, has pain interfered with the following? General activity 10 Relation with others 10 Enjoyment of life 10 What TIME of day is your pain at its worst? all Sleep (in general) Fair  Pain is worse with: walking, bending, sitting, inactivity and standing Pain improves with: rest, heat/ice and medication Relief from Meds: 8  Mobility walk with assistance use a walker how many minutes can you walk? 3 ability to climb steps?  no do you drive?  no use a wheelchair needs help with transfers Do you have any goals in this area?  no  Function I need assistance with the following:  feeding, dressing, bathing, meal prep, household duties and shopping Do you have any goals in this area?  no  Neuro/Psych No problems in this area  Prior Studies Any changes since last visit?  no  Physicians involved in your care Any changes since last visit?  no   Family History  Problem Relation Age of Onset  . Diabetes Sister   . Heart disease Sister    History   Social History  . Marital Status: Widowed    Spouse Name: N/A  . Number of Children: N/A  . Years of Education: N/A   Social History Main Topics  . Smoking status: Never Smoker   . Smokeless tobacco: Never Used  . Alcohol Use: No  . Drug Use: No  . Sexual Activity: Not on file   Other Topics Concern  . None   Social History Narrative   Past Surgical History  Procedure Laterality Date  .  Breast lumpectomy    . Knee surgery    . Breast lumpectomy     Past Medical History  Diagnosis Date  . Hypertension   . Atrial fibrillation    BP 175/103 mmHg  Pulse 71  Resp 14  SpO2 96%  Opioid Risk Score:   Fall Risk Score: Moderate Fall Risk (6-13 points)`1  Depression screen PHQ 2/9  Depression screen PHQ 2/9 07/03/2014  Decreased Interest 3  Down, Depressed, Hopeless 0  PHQ - 2 Score 3  Altered sleeping 0  Tired, decreased energy 3  Change in appetite 3  Feeling bad or failure about yourself  0  Trouble concentrating 2  Moving slowly or fidgety/restless 0  Suicidal thoughts 0  PHQ-9 Score 11     Review of Systems  All other systems reviewed and are negative.      Objective:   Physical Exam  Constitutional: She is oriented to person, place, and time. She appears well-developed and well-nourished.  HENT:  Head: Normocephalic and atraumatic.  Musculoskeletal:  Osteoarthritic changes at the PIPs and DIPs of both hands Shoulder contracture bilaterally gets up to about 45 of flexion and abduction on the left side and 60 on the right side Knee extension is full bilaterally Healed left TKR scar Right knee mild tenderness along the  medial joint line No evidence of knee deformities  Neurological: She is alert and oriented to person, place, and time. Abnormal gait:  due to range of motion.  Negative straight leg raising test 4/5 strength in bilateral grip biceps triceps 3 minus at the deltoids 4/5 in bilateral lower extremities  Psychiatric: She has a normal mood and affect.  Nursing note and vitals reviewed.         Assessment & Plan:  1. Lumbar spinal stenosis and Chronic low back pain. Functional status is remaining steady, no significant radicular discomfort Refilled: oxyCODONE 5/325 mg One tablet every 4 hours as needed not more than five a day. #150  We discussed with aging she can develop more side effects with her medications such as confusion or  falls. I discussed this with her daughter to be on the look out for this. 2. Cervical spondylosis with extremely limited range of motion: Continue Current Medication Regime  3. Shoulder osteoarthritis with contracture: Continue Current Medication regime. Left shoulder is more contracted the right I do not think she would benefit from injection at this point given the chronicity If her tolerance of pain medicines decreases may consider suprascapular nerve block   F/U in 1 month, Nurse practitioner

## 2014-08-01 LAB — PMP ALCOHOL METABOLITE (ETG): ETGU: NEGATIVE ng/mL

## 2014-08-04 LAB — OPIATES/OPIOIDS (LC/MS-MS)
CODEINE URINE: NEGATIVE ng/mL (ref <50)
Hydrocodone: NEGATIVE ng/mL (ref <50)
Hydromorphone: NEGATIVE ng/mL (ref <50)
MORPHINE: NEGATIVE ng/mL (ref <50)
NORHYDROCODONE, UR: NEGATIVE ng/mL (ref <50)
Noroxycodone, Ur: 2993 ng/mL (ref <50)
OXYCODONE, UR: 1569 ng/mL (ref <50)
Oxymorphone: 2442 ng/mL (ref <50)

## 2014-08-04 LAB — OXYCODONE, URINE (LC/MS-MS)
Noroxycodone, Ur: 2993 ng/mL (ref <50)
Oxycodone, ur: 1569 ng/mL (ref <50)
Oxymorphone: 2442 ng/mL (ref <50)

## 2014-08-07 LAB — PRESCRIPTION MONITORING PROFILE (SOLSTAS)
AMPHETAMINE/METH: NEGATIVE ng/mL
BUPRENORPHINE, URINE: NEGATIVE ng/mL
Barbiturate Screen, Urine: NEGATIVE ng/mL
Benzodiazepine Screen, Urine: NEGATIVE ng/mL
Cannabinoid Scrn, Ur: NEGATIVE ng/mL
Carisoprodol, Urine: NEGATIVE ng/mL
Cocaine Metabolites: NEGATIVE ng/mL
Creatinine, Urine: 90.29 mg/dL (ref >20.0)
ECSTASY: NEGATIVE ng/mL
Fentanyl, Ur: NEGATIVE ng/mL
METHADONE SCREEN, URINE: NEGATIVE ng/mL
Meperidine, Ur: NEGATIVE ng/mL
NITRITES URINE, INITIAL: NEGATIVE ug/mL
PH URINE, INITIAL: 6.1 pH (ref 4.5–8.9)
PROPOXYPHENE: NEGATIVE ng/mL
Tapentadol, urine: NEGATIVE ng/mL
Tramadol Scrn, Ur: NEGATIVE ng/mL
ZOLPIDEM, URINE: NEGATIVE ng/mL

## 2014-08-15 NOTE — Progress Notes (Signed)
Urine drug screen for this encounter is consistent for prescribed medication 

## 2014-09-03 ENCOUNTER — Encounter: Payer: Medicare Other | Attending: Physical Medicine & Rehabilitation | Admitting: Registered Nurse

## 2014-09-03 ENCOUNTER — Encounter: Payer: Self-pay | Admitting: Registered Nurse

## 2014-09-03 VITALS — BP 151/87 | HR 73 | Resp 14

## 2014-09-03 DIAGNOSIS — M19012 Primary osteoarthritis, left shoulder: Secondary | ICD-10-CM | POA: Diagnosis present

## 2014-09-03 DIAGNOSIS — G894 Chronic pain syndrome: Secondary | ICD-10-CM | POA: Diagnosis not present

## 2014-09-03 DIAGNOSIS — M179 Osteoarthritis of knee, unspecified: Secondary | ICD-10-CM | POA: Diagnosis not present

## 2014-09-03 DIAGNOSIS — M1711 Unilateral primary osteoarthritis, right knee: Secondary | ICD-10-CM

## 2014-09-03 DIAGNOSIS — M19011 Primary osteoarthritis, right shoulder: Secondary | ICD-10-CM | POA: Diagnosis present

## 2014-09-03 DIAGNOSIS — M4806 Spinal stenosis, lumbar region: Secondary | ICD-10-CM | POA: Insufficient documentation

## 2014-09-03 DIAGNOSIS — Z5181 Encounter for therapeutic drug level monitoring: Secondary | ICD-10-CM

## 2014-09-03 DIAGNOSIS — Z79899 Other long term (current) drug therapy: Secondary | ICD-10-CM | POA: Insufficient documentation

## 2014-09-03 DIAGNOSIS — M47812 Spondylosis without myelopathy or radiculopathy, cervical region: Secondary | ICD-10-CM | POA: Diagnosis not present

## 2014-09-03 DIAGNOSIS — M2550 Pain in unspecified joint: Secondary | ICD-10-CM | POA: Insufficient documentation

## 2014-09-03 MED ORDER — OXYCODONE-ACETAMINOPHEN 5-325 MG PO TABS: 1.0000 | ORAL_TABLET | ORAL | Status: DC | PRN

## 2014-09-03 NOTE — Progress Notes (Signed)
Subjective:    Patient ID: [name], female    DOB: July 07, 1917, 81 y.o.   MRN: [id number]  HPI: [name] is a 81 year old female who returns for follow up for chronic pain and medication refill. She says her pain is located in her left shoulder and right knee also states she has pain all over her body.She rates her pain 8. Her current exercise regime is walking in her home from the bathroom to the kitchen and walking from the bedroom to the bathroom.  Her daughter has some personal things to attend to in the month of July, she will not be able to bring her mother to [facility] appointment. She will be given a second script this month. She verbalizes understanding.  Daughter in room.  Pain Inventory Average Pain 8 Pain Right Now 8 My pain is sharp, burning, dull, stabbing, tingling and aching  In the last 24 hours, has pain interfered with the following? General activity 10 Relation with others 10 Enjoyment of life 10 What TIME of day is your pain at its worst? all Sleep (in general) Good  Pain is worse with: walking, bending, sitting, inactivity and standing Pain improves with: rest, heat/ice and medication Relief from Meds: 8  Mobility walk with assistance use a walker how many minutes can you walk? 3-4 ability to climb steps?  no do you drive?  no use a wheelchair needs help with transfers Do you have any goals in this area?  no  Function retired I need assistance with the following:  dressing, bathing, toileting, meal prep, household duties and shopping Do you have any goals in this area?  no  Neuro/Psych No problems in this area  Prior Studies Any changes since last visit?  no  Physicians involved in your care Any changes since last visit?  no   Family History  Problem Relation Age of Onset  . Diabetes Sister   . Heart disease Sister    History   Social History  . Marital Status: Widowed    Spouse Name: N/A  . Number of Children: N/A  .  Years of Education: N/A   Social History Main Topics  . Smoking status: Never Smoker   . Smokeless tobacco: Never Used  . Alcohol Use: No  . Drug Use: No  . Sexual Activity: Not on file   Other Topics Concern  . None   Social History Narrative   Past Surgical History  Procedure Laterality Date  . Breast lumpectomy    . Knee surgery    . Breast lumpectomy     Past Medical History  Diagnosis Date  . Hypertension   . Atrial fibrillation    BP 151/87 mmHg  Pulse 73  Resp 14  SpO2 95%  Opioid Risk Score:   Fall Risk Score: Moderate Fall Risk (6-13 points)`1  Depression screen PHQ 2/9  Depression screen PHQ 2/9 07/03/2014  Decreased Interest 3  Down, Depressed, Hopeless 0  PHQ - 2 Score 3  Altered sleeping 0  Tired, decreased energy 3  Change in appetite 3  Feeling bad or failure about yourself  0  Trouble concentrating 2  Moving slowly or fidgety/restless 0  Suicidal thoughts 0  PHQ-9 Score 11     Review of Systems  Constitutional: Negative.   HENT: Negative.   Eyes: Negative.   Respiratory: Negative.   Cardiovascular: Negative.   Gastrointestinal: Negative.   Endocrine: Negative.   Genitourinary: Negative.   Musculoskeletal:  Negative.   Skin: Negative.   Allergic/Immunologic: Negative.   Neurological: Negative.   Hematological: Negative.   Psychiatric/Behavioral: Negative.   All other systems reviewed and are negative.      Objective:   Physical Exam  Constitutional: She is oriented to person, place, and time. She appears well-developed and well-nourished.  HENT:  Head: Normocephalic and atraumatic.  Neck: Normal range of motion. Neck supple.  Cardiovascular: Normal rate and regular rhythm.   Pulmonary/Chest: Effort normal and breath sounds normal.  Musculoskeletal:  Normal Muscle Bulk and Muscle Testing Reveals: Upper Extremities: Decreased ROM 45 Degrees and Muscle Strength 5/5 Lower Extremities: Full ROM and Muscle Strength 5/5 Arises from  chair with ease/ Using walker for support Narrow Based gait   Neurological: She is alert and oriented to person, place, and time.  Skin: Skin is warm and dry.  Psychiatric: She has a normal mood and affect.  Nursing note and vitals reviewed.         Assessment & Plan:  1. Lumbar spinal stenosis and Chronic low back pain.  Refilled: oxyCODONE 5/325 mg One tablet every 4 hours as needed not more than five a day. #150 . Second script given 2. Cervical spondylosis with extremely limited range of motion: Continue Current Medication Regime  3. Shoulder osteoarthritis with contracture: Continue Current Medication regime.   41minutes of face to face patient care time was spent during this visit. All questions were encouraged and answered.   F/U in 1 month

## 2014-09-27 ENCOUNTER — Ambulatory Visit: Payer: Medicare Other | Admitting: Registered Nurse

## 2014-10-17 ENCOUNTER — Inpatient Hospital Stay (HOSPITAL_COMMUNITY)
Admission: EM | Admit: 2014-10-17 | Discharge: 2014-10-19 | DRG: 682 | Disposition: A | Discharge status: Hospice | Payer: Medicare Other | Attending: Internal Medicine | Admitting: Internal Medicine

## 2014-10-17 ENCOUNTER — Encounter (HOSPITAL_COMMUNITY): Payer: Self-pay | Admitting: *Deleted

## 2014-10-17 ENCOUNTER — Emergency Department (HOSPITAL_COMMUNITY): Payer: Medicare Other

## 2014-10-17 DIAGNOSIS — Z79899 Other long term (current) drug therapy: Secondary | ICD-10-CM

## 2014-10-17 DIAGNOSIS — Z885 Allergy status to narcotic agent status: Secondary | ICD-10-CM

## 2014-10-17 DIAGNOSIS — R1310 Dysphagia, unspecified: Secondary | ICD-10-CM | POA: Diagnosis present

## 2014-10-17 DIAGNOSIS — I129 Hypertensive chronic kidney disease with stage 1 through stage 4 chronic kidney disease, or unspecified chronic kidney disease: Secondary | ICD-10-CM | POA: Diagnosis present

## 2014-10-17 DIAGNOSIS — Z66 Do not resuscitate: Secondary | ICD-10-CM | POA: Diagnosis present

## 2014-10-17 DIAGNOSIS — Z79891 Long term (current) use of opiate analgesic: Secondary | ICD-10-CM | POA: Diagnosis not present

## 2014-10-17 DIAGNOSIS — M2550 Pain in unspecified joint: Secondary | ICD-10-CM

## 2014-10-17 DIAGNOSIS — I482 Chronic atrial fibrillation, unspecified: Secondary | ICD-10-CM | POA: Insufficient documentation

## 2014-10-17 DIAGNOSIS — Z85828 Personal history of other malignant neoplasm of skin: Secondary | ICD-10-CM | POA: Diagnosis not present

## 2014-10-17 DIAGNOSIS — Z6820 Body mass index (BMI) 20.0-20.9, adult: Secondary | ICD-10-CM

## 2014-10-17 DIAGNOSIS — N184 Chronic kidney disease, stage 4 (severe): Secondary | ICD-10-CM | POA: Diagnosis present

## 2014-10-17 DIAGNOSIS — Z7901 Long term (current) use of anticoagulants: Secondary | ICD-10-CM | POA: Diagnosis not present

## 2014-10-17 DIAGNOSIS — E43 Unspecified severe protein-calorie malnutrition: Secondary | ICD-10-CM | POA: Diagnosis present

## 2014-10-17 DIAGNOSIS — M1389 Other specified arthritis, multiple sites: Secondary | ICD-10-CM | POA: Diagnosis present

## 2014-10-17 DIAGNOSIS — R627 Adult failure to thrive: Secondary | ICD-10-CM | POA: Diagnosis present

## 2014-10-17 DIAGNOSIS — I509 Heart failure, unspecified: Secondary | ICD-10-CM | POA: Diagnosis present

## 2014-10-17 DIAGNOSIS — L98429 Non-pressure chronic ulcer of back with unspecified severity: Secondary | ICD-10-CM | POA: Diagnosis present

## 2014-10-17 DIAGNOSIS — Z7401 Bed confinement status: Secondary | ICD-10-CM

## 2014-10-17 DIAGNOSIS — E780 Pure hypercholesterolemia: Secondary | ICD-10-CM | POA: Diagnosis present

## 2014-10-17 DIAGNOSIS — K5900 Constipation, unspecified: Secondary | ICD-10-CM | POA: Diagnosis present

## 2014-10-17 DIAGNOSIS — R410 Disorientation, unspecified: Secondary | ICD-10-CM

## 2014-10-17 DIAGNOSIS — I350 Nonrheumatic aortic (valve) stenosis: Secondary | ICD-10-CM | POA: Diagnosis present

## 2014-10-17 DIAGNOSIS — Z888 Allergy status to other drugs, medicaments and biological substances status: Secondary | ICD-10-CM | POA: Diagnosis not present

## 2014-10-17 DIAGNOSIS — R001 Bradycardia, unspecified: Secondary | ICD-10-CM | POA: Diagnosis present

## 2014-10-17 DIAGNOSIS — Z515 Encounter for palliative care: Secondary | ICD-10-CM | POA: Diagnosis not present

## 2014-10-17 DIAGNOSIS — L89159 Pressure ulcer of sacral region, unspecified stage: Secondary | ICD-10-CM | POA: Diagnosis present

## 2014-10-17 DIAGNOSIS — E860 Dehydration: Secondary | ICD-10-CM | POA: Diagnosis present

## 2014-10-17 DIAGNOSIS — M109 Gout, unspecified: Secondary | ICD-10-CM | POA: Diagnosis present

## 2014-10-17 DIAGNOSIS — N179 Acute kidney failure, unspecified: Principal | ICD-10-CM | POA: Diagnosis present

## 2014-10-17 DIAGNOSIS — G8929 Other chronic pain: Secondary | ICD-10-CM | POA: Diagnosis present

## 2014-10-17 DIAGNOSIS — R339 Retention of urine, unspecified: Secondary | ICD-10-CM | POA: Diagnosis present

## 2014-10-17 DIAGNOSIS — K219 Gastro-esophageal reflux disease without esophagitis: Secondary | ICD-10-CM | POA: Diagnosis present

## 2014-10-17 DIAGNOSIS — R34 Anuria and oliguria: Secondary | ICD-10-CM | POA: Diagnosis present

## 2014-10-17 DIAGNOSIS — M47812 Spondylosis without myelopathy or radiculopathy, cervical region: Secondary | ICD-10-CM | POA: Diagnosis present

## 2014-10-17 DIAGNOSIS — R791 Abnormal coagulation profile: Secondary | ICD-10-CM

## 2014-10-17 HISTORY — DX: Unspecified malignant neoplasm of skin, unspecified: C44.90

## 2014-10-17 HISTORY — DX: Pure hypercholesterolemia, unspecified: E78.00

## 2014-10-17 HISTORY — DX: Heart failure, unspecified: I50.9

## 2014-10-17 HISTORY — DX: Gastro-esophageal reflux disease without esophagitis: K21.9

## 2014-10-17 HISTORY — DX: Reserved for inherently not codable concepts without codable children: IMO0001

## 2014-10-17 HISTORY — DX: Personal history of other diseases of the digestive system: Z87.19

## 2014-10-17 HISTORY — DX: Cardiac murmur, unspecified: R01.1

## 2014-10-17 HISTORY — DX: Chronic kidney disease, stage 4 (severe): N18.4

## 2014-10-17 HISTORY — DX: Gout, unspecified: M10.9

## 2014-10-17 HISTORY — DX: Unspecified osteoarthritis, unspecified site: M19.90

## 2014-10-17 LAB — RAPID URINE DRUG SCREEN, HOSP PERFORMED
Amphetamines: NOT DETECTED
Barbiturates: NOT DETECTED
Benzodiazepines: NOT DETECTED
Cocaine: NOT DETECTED
Opiates: POSITIVE — AB
Tetrahydrocannabinol: NOT DETECTED

## 2014-10-17 LAB — CBC
HCT: 34.6 % — ABNORMAL LOW (ref 36.0–46.0)
Hemoglobin: 10.6 g/dL — ABNORMAL LOW (ref 12.0–15.0)
MCH: 27.1 pg (ref 26.0–34.0)
MCHC: 30.6 g/dL (ref 30.0–36.0)
MCV: 88.5 fL (ref 78.0–100.0)
Platelets: 103 10*3/uL — ABNORMAL LOW (ref 150–400)
RBC: 3.91 MIL/uL (ref 3.87–5.11)
RDW: 17.8 % — ABNORMAL HIGH (ref 11.5–15.5)
WBC: 8.6 10*3/uL (ref 4.0–10.5)

## 2014-10-17 LAB — DIFFERENTIAL
Basophils Absolute: 0 10*3/uL (ref 0.0–0.1)
Basophils Relative: 0 % (ref 0–1)
Eosinophils Absolute: 0 10*3/uL (ref 0.0–0.7)
Eosinophils Relative: 0 % (ref 0–5)
LYMPHS PCT: 3 % — AB (ref 12–46)
Lymphs Abs: 0.3 10*3/uL — ABNORMAL LOW (ref 0.7–4.0)
MONO ABS: 0.5 10*3/uL (ref 0.1–1.0)
MONOS PCT: 5 % (ref 3–12)
Neutro Abs: 7.8 10*3/uL — ABNORMAL HIGH (ref 1.7–7.7)
Neutrophils Relative %: 91 % — ABNORMAL HIGH (ref 43–77)

## 2014-10-17 LAB — PROTIME-INR
INR: 4.08 — ABNORMAL HIGH (ref 0.00–1.49)
Prothrombin Time: 38.6 seconds — ABNORMAL HIGH (ref 11.6–15.2)

## 2014-10-17 LAB — COMPREHENSIVE METABOLIC PANEL
ALT: 15 U/L (ref 14–54)
ANION GAP: 9 (ref 5–15)
AST: 14 U/L — AB (ref 15–41)
Albumin: 2.8 g/dL — ABNORMAL LOW (ref 3.5–5.0)
Alkaline Phosphatase: 62 U/L (ref 38–126)
BUN: 94 mg/dL — ABNORMAL HIGH (ref 6–20)
CHLORIDE: 94 mmol/L — AB (ref 101–111)
CO2: 33 mmol/L — ABNORMAL HIGH (ref 22–32)
CREATININE: 2.52 mg/dL — AB (ref 0.44–1.00)
Calcium: 8.9 mg/dL (ref 8.9–10.3)
GFR calc non Af Amer: 15 mL/min — ABNORMAL LOW (ref >60)
GFR, EST AFRICAN AMERICAN: 18 mL/min — AB (ref >60)
GLUCOSE: 114 mg/dL — AB (ref 65–99)
Potassium: 4.5 mmol/L (ref 3.5–5.1)
Sodium: 136 mmol/L (ref 135–145)
Total Bilirubin: 0.5 mg/dL (ref 0.3–1.2)
Total Protein: 5.2 g/dL — ABNORMAL LOW (ref 6.5–8.1)

## 2014-10-17 LAB — URINALYSIS, ROUTINE W REFLEX MICROSCOPIC
Bilirubin Urine: NEGATIVE
GLUCOSE, UA: NEGATIVE mg/dL
Hgb urine dipstick: NEGATIVE
Ketones, ur: NEGATIVE mg/dL
Leukocytes, UA: NEGATIVE
NITRITE: NEGATIVE
PROTEIN: NEGATIVE mg/dL
Specific Gravity, Urine: 1.015 (ref 1.005–1.030)
UROBILINOGEN UA: 0.2 mg/dL (ref 0.0–1.0)
pH: 5 (ref 5.0–8.0)

## 2014-10-17 LAB — ETHANOL

## 2014-10-17 LAB — TROPONIN I: TROPONIN I: 0.05 ng/mL — AB (ref <0.031)

## 2014-10-17 MED ORDER — BISACODYL 10 MG RE SUPP
10.0000 mg | Freq: Once | RECTAL | Status: AC
  Administered 2014-10-17: 10 mg via RECTAL
  Filled 2014-10-17: qty 1

## 2014-10-17 MED ORDER — RESOURCE THICKENUP CLEAR PO POWD
ORAL | Status: DC | PRN
  Filled 2014-10-17: qty 125

## 2014-10-17 MED ORDER — ACETAMINOPHEN 325 MG PO TABS: 650.0000 mg | ORAL_TABLET | Freq: Four times a day (QID) | ORAL | Status: DC | PRN

## 2014-10-17 MED ORDER — SODIUM CHLORIDE 0.9 % IV BOLUS (SEPSIS)
500.0000 mL | Freq: Once | INTRAVENOUS | Status: AC
  Administered 2014-10-17: 500 mL via INTRAVENOUS

## 2014-10-17 MED ORDER — PANTOPRAZOLE SODIUM 40 MG PO TBEC
40.0000 mg | DELAYED_RELEASE_TABLET | Freq: Every day | ORAL | Status: DC
  Administered 2014-10-18 – 2014-10-19 (×2): 40 mg via ORAL

## 2014-10-17 MED ORDER — ONDANSETRON HCL 4 MG/2ML IJ SOLN
4.0000 mg | Freq: Four times a day (QID) | INTRAMUSCULAR | Status: DC | PRN
Start: 2014-10-17 — End: 2014-10-19

## 2014-10-17 MED ORDER — DOCUSATE SODIUM 100 MG PO CAPS
100.0000 mg | ORAL_CAPSULE | Freq: Two times a day (BID) | ORAL | Status: DC
  Filled 2014-10-17 (×3): qty 1

## 2014-10-17 MED ORDER — OXYCODONE-ACETAMINOPHEN 5-325 MG PO TABS
1.0000 | ORAL_TABLET | Freq: Three times a day (TID) | ORAL | Status: DC | PRN
  Administered 2014-10-18 – 2014-10-19 (×2): 1 via ORAL
  Filled 2014-10-17 (×2): qty 1

## 2014-10-17 MED ORDER — LORAZEPAM 2 MG/ML IJ SOLN
0.5000 mg | INTRAMUSCULAR | Status: DC | PRN
Start: 2014-10-17 — End: 2014-10-19

## 2014-10-17 MED ORDER — SODIUM CHLORIDE 0.9 % IJ SOLN
3.0000 mL | Freq: Two times a day (BID) | INTRAMUSCULAR | Status: DC
Start: 2014-10-17 — End: 2014-10-19
  Administered 2014-10-18 – 2014-10-19 (×3): 3 mL via INTRAVENOUS

## 2014-10-17 MED ORDER — SODIUM CHLORIDE 0.9 % IV SOLN
INTRAVENOUS | Status: AC
  Administered 2014-10-17: 50 mL/h via INTRAVENOUS

## 2014-10-17 MED ORDER — MORPHINE SULFATE 2 MG/ML IJ SOLN: 1.0000 mg | INTRAMUSCULAR | Status: DC | PRN

## 2014-10-17 MED ORDER — ACETAMINOPHEN 650 MG RE SUPP: 650.0000 mg | Freq: Four times a day (QID) | RECTAL | Status: DC | PRN

## 2014-10-17 MED ORDER — ONDANSETRON HCL 4 MG PO TABS: 4.0000 mg | ORAL_TABLET | Freq: Four times a day (QID) | ORAL | Status: DC | PRN

## 2014-10-17 MED ORDER — ENSURE ENLIVE PO LIQD
237.0000 mL | Freq: Three times a day (TID) | ORAL | Status: DC
  Administered 2014-10-18 – 2014-10-19 (×3): 237 mL via ORAL

## 2014-10-17 MED ORDER — WARFARIN - PHARMACIST DOSING INPATIENT: Freq: Every day | Status: DC

## 2014-10-17 MED ORDER — ALUM & MAG HYDROXIDE-SIMETH 200-200-20 MG/5ML PO SUSP: 30.0000 mL | Freq: Four times a day (QID) | ORAL | Status: DC | PRN

## 2014-10-17 NOTE — ED Notes (Signed)
Patient transported to CT 

## 2014-10-17 NOTE — ED Notes (Signed)
Lab called and states cmp and troponin is hemolyzed and needs to be recollected.

## 2014-10-17 NOTE — Progress Notes (Signed)
ANTICOAGULATION CONSULT NOTE - Initial Consult  Pharmacy Consult for Coumadin Indication: atrial fibrillation  Allergies  Allergen Reactions  . Tramadol Swelling    In legs  . Neurontin [Gabapentin]     EDEMA    Patient Measurements:    Vital Signs: Temp: 97.8 F (36.6 C) (07/20 1449) Temp Source: Oral (07/20 1449) BP: 133/83 mmHg (07/20 1449) Pulse Rate: 53 (07/20 1449)  Labs:  Recent Labs  10/17/14 0945 10/17/14 1019  HGB 10.6*  --   HCT 34.6*  --   PLT 103*  --   LABPROT 38.6*  --   INR 4.08*  --   CREATININE  --  2.52*  TROPONINI  --  0.05*    CrCl cannot be calculated (Unknown ideal weight.).   Medical History: Past Medical History  Diagnosis Date  . Hypertension   . Atrial fibrillation      Assessment: 81yo female on Coumadin PTA for Afib, presented after 3 days of not eating or drinking.  Pt currently w/ AKI w/ elevated BUN and SCr.  INR on admit supratherapeutic at 4.08, possibly in part due to poor PO intake.  H&H 10.6/34.6, PLT 103, no bleeding noted.  Expect Coumadin requirements will likely be lower during times of poor PO intake.  PTA dose: 2mg  MWF, 3mg  all other days  Goal of Therapy:  INR 2-3 Monitor platelets by anticoagulation protocol: Yes   Plan:  -Hold Coumadin today, 7/20 -Monitor INR, CBC, s/sx of bleeding  Drucie Opitz, PharmD Clinical Pharmacy Resident Pager: [phone] 10/17/2014 3:16 PM

## 2014-10-17 NOTE — Consult Note (Signed)
WOC wound consult note Reason for Consult: Consult requested for sacrum.  Daughter at bedside also assessed site. Wound type: Dark purple with intact skin; deep tissue injury to bilat buttocks; Left 2X2cm Right 3X3cm, spread across sacrum and bilat buttocks. Pressure Ulcer POA: Yes Dressing procedure/placement/frequency: Foam dressing to protect and reduce shear to affected area.  Discussed plan of care with daughter and the fact that despite optimal care, deep tissue injuries frequently evolve into full thickness tissue loss during the following week.  She did not appear to be concerned and also states that patient's appetite has been very poor and she is immobile.  Multiple systemic factors can impair healing potential. Please re-consult if further assistance is needed.  Thank-you,  Julien Girt MSN, [place], [place], [place], [place]

## 2014-10-17 NOTE — H&P (Signed)
[facility] History and Physical                                                                                    [name], is a 81 y.o. female  MRN: [id number]   DOB - Oct 28, 1917  Admit Date - 10/17/2014  Outpatient Primary MD for the patient is Stephens Shire, MD  Referring Physician:    Chief Complaint:   Chief Complaint  Patient presents with  . Fatigue  . Anorexia     HPI  [name]  is a 81 y.o. female, with atrial fibrillation, hypertension, and severe aortic stenosis. She presents to the emergency department today. After 3 days of not eating or drinking. She has stayed in bed and rarely spoken. Her daughter reports that she has not urinated for several days. She discussed this with her PCP yesterday who advised giving the patient 40 mg of Lasix orally. When the patient did urinate it was dark brown. The daughter reports the patient has barely eaten.  Food appears to nauseate her. The patient now speaks very little. She appears to be shutting down.  In the emergency department the patient was found to have a significantly elevated BUN and creatinine. She has significant stool burden on x-ray. She is somnolent.  I discussed the natural process of dying with her daughter. Her daughter understands but wants her mother to receive IV fluids and treatment at this point. The daughter and great granddaughter indicate that they believe the patient would not want to return to the hospital after this admission. They are in agreement with engaging hospice.  Review of Systems   Patient unable to give review of systems as she is lethargic. She does deny pain.  Past Medical History  Past Medical History  Diagnosis Date  . Hypertension   . Atrial fibrillation     Past Surgical History  Procedure Laterality Date  . Breast lumpectomy    . Knee surgery    . Breast lumpectomy        Social History History  Substance Use Topics  . Smoking status: Never Smoker   .  Smokeless tobacco: Never Used  . Alcohol Use: No   lives at home with daughter. Previously got up twice a day to eat and take medicines. Needs assistance with ADLs.  Family History Family History  Problem Relation Age of Onset  . Diabetes Sister   . Heart disease Sister     Prior to Admission medications   Medication Sig Start Date End Date Taking? Authorizing Provider  carvedilol (COREG) 6.25 MG tablet Take 6.25 mg by mouth 2 (two) times daily with a meal.   Yes Historical Provider, MD  COLCRYS 0.6 MG tablet Take 1 tablet by mouth as needed. 11/11/11  Yes Historical Provider, MD  diltiazem (CARDIZEM CD) 120 MG 24 hr capsule Take 1 tablet by mouth Daily. 12/21/11  Yes Historical Provider, MD  food thickener (SIMPLYTHICK) POWD Take by mouth as needed.   Yes Historical Provider, MD  furosemide (LASIX) 40 MG tablet Take 40 mg by mouth daily.   Yes Historical Provider, MD  losartan (COZAAR) 100 MG tablet Take 100 mg  by mouth daily.   Yes Historical Provider, MD  oxyCODONE-acetaminophen (PERCOCET/ROXICET) 5-325 MG per tablet Take 1 tablet by mouth every 4 (four) hours as needed (not more than 5 per day). 09/03/14  Yes Bayard Hugger, NP  pantoprazole (PROTONIX) 40 MG tablet Take 40 mg by mouth 2 (two) times daily.   Yes Historical Provider, MD  senna-docusate (SENNA PLUS) 8.6-50 MG per tablet Take 2-3 tablets by mouth 2 (two) times daily. 3 tablets in the morning and 2 tablets in the evening   Yes Historical Provider, MD  warfarin (COUMADIN) 2 MG tablet Take 2-3 mg by mouth daily. 2mg  on Monday Wednesday Friday, 3mg  on Sunday Tuesday, Thursday and Saturday   Yes Historical Provider, MD    Allergies  Allergen Reactions  . Tramadol Swelling    In legs  . Neurontin [Gabapentin]     EDEMA    Physical Exam  Vitals  Blood pressure 136/71, pulse 54, temperature 97.6 F (36.4 C), temperature source Rectal, resp. rate 16, SpO2 96 %.   General: Elderly, frail, female,  lying in bed barely  speaking.  Daughter and grand dtr at bedside.  Psych:  Somnolent.  ENT:  Ears and Eyes appear Normal, Conjunctivae clear, PER. Dry mm without erythema or exudates.  Neck:  Supple, No lymphadenopathy appreciated  Respiratory:  Symmetrical chest wall movement, Good air movement bilaterally, CTAB.  Cardiac:  Loletha Grayer, irregular 4/6 aortic stenosis. no JVD.    Abdomen:  Slightly distended, firm, decreased bowel sounds  Skin:  No Cyanosis,+tenting, No Skin Rash or Bruise. Sacral area not examined.  Extremities:  Able to move all 4.   Data Review  CBC  Recent Labs Lab 10/17/14 0945  WBC 8.6  HGB 10.6*  HCT 34.6*  PLT 103*  MCV 88.5  MCH 27.1  MCHC 30.6  RDW 17.8*  LYMPHSABS 0.3*  MONOABS 0.5  EOSABS 0.0  BASOSABS 0.0    Chemistries   Recent Labs Lab 10/17/14 1019  NA 136  K 4.5  CL 94*  CO2 33*  GLUCOSE 114*  BUN 94*  CREATININE 2.52*  CALCIUM 8.9  AST 14*  ALT 15  ALKPHOS 62  BILITOT 0.5     Coagulation profile  Recent Labs Lab 10/17/14 0945  INR 4.08*     Cardiac Enzymes  Recent Labs Lab 10/17/14 1019  TROPONINI 0.05*     Urinalysis    Component Value Date/Time   COLORURINE YELLOW 10/17/2014 1045   APPEARANCEUR CLEAR 10/17/2014 1045   LABSPEC 1.015 10/17/2014 1045   PHURINE 5.0 10/17/2014 1045   GLUCOSEU NEGATIVE 10/17/2014 1045   HGBUR NEGATIVE 10/17/2014 [place] 10/17/2014 [place] 10/17/2014 1045   PROTEINUR NEGATIVE 10/17/2014 1045   UROBILINOGEN 0.2 10/17/2014 1045   NITRITE NEGATIVE 10/17/2014 1045   LEUKOCYTESUR NEGATIVE 10/17/2014 1045    Imaging results:   Ct Head Wo Contrast  10/17/2014   CLINICAL DATA:  Weakness, unable to eat  EXAM: CT HEAD WITHOUT CONTRAST  TECHNIQUE: Contiguous axial images were obtained from the base of the skull through the vertex without intravenous contrast.  COMPARISON:  07/05/2009  FINDINGS: No skull fracture is noted. Paranasal sinuses and mastoid air  cells are unremarkable. Moderate cerebral atrophy. Mild periventricular white matter decreased attenuation probable due to chronic small vessel ischemic changes. Atherosclerotic calcification of carotid siphon. No acute cortical infarction. No mass lesion is noted on this unenhanced scan.  IMPRESSION: No acute intracranial abnormality. Moderate cerebral atrophy. Mild  periventricular white matter decreased attenuation probable due to chronic small vessel ischemic changes. No definite acute cortical infarction.   Electronically Signed   By: Lahoma Crocker M.D.   On: 10/17/2014 10:08   Dg Abd Acute W/chest  10/17/2014   CLINICAL DATA:  Abdominal pain and weakness  EXAM: DG ABDOMEN ACUTE W/ 1V CHEST  COMPARISON:  Abdominal radiograph June 19, 2010; chest radiograph April 23 04/19/2011  FINDINGS: PA chest: There is no edema or consolidation. Heart is mildly enlarged with pulmonary vascularity within normal limits. Prominence the right peritracheal region most likely represents great vessel prominence in this age group. The aorta is prominent and tortuous with extensive atherosclerotic calcification, stable. There is extensive arthropathy in both shoulders with chronic avascular necrosis in each humeral head.  Supine and left lateral decubitus abdomen: There is diffuse stool throughout the colon. There is no bowel dilatation or air-fluid level suggesting obstruction. No free air. Extensive arterial vascular calcification present. There is lumbar scoliosis with degenerative type change. Bones are diffusely osteoporotic.  IMPRESSION: Diffuse stool throughout colon. Bowel gas pattern unremarkable. No obstruction or free air. No lung edema or consolidation. Stable cardiomegaly. Extensive atherosclerotic calcification. 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   Electronically Signed   By: Lowella Grip III M.D.   On: 10/17/2014 10:18    My personal review of EKG: Afib with bradycardia   Assessment &  Plan  Principal Problem:   AKI (acute kidney injury) Active Problems:   Cervical spondylosis without myelopathy   Chronic pain of multiple joints   Aortic stenosis, severe   Bradycardia   Oliguria   Sacral ulcer  Acute kidney injury in the setting of stage 3-4 chronic kidney disease. Due to poor oral intake. The last recorded BUN/creatinine in Epic was in 2012 (40/1.61). Today it is (94/2.52) Patient has Oliguria and general failure to thrive per family. She is not eating/drinking, getting out of bed or talking much at all. Will carefully give IV fluids and hold any nephrotoxic medications such as Lasix, Cozaar, Colcrys. I believe her body is naturally shutting down.  Atrial fibrillation with Bradycardia currently  (36-58). In the setting of severe aortic stenosis Patient regularly sees Dr. Wynonia Lawman - I called him for any recommendations.  He reports she is essentially bed bound with severe chronic pain and has a DNR order.  He recommends stopping the Toprol and Diltiazem, but no further work up for AS or Bradycardia would be appropriate given her age and status. Continue Coumadin per pharmacy. Patient is currently supratherapeutic at 4.0.  Sacral Ulceration Wound consultation requested.  Chronic pain Continue oral pain medications. Supplement with Tylenol and very low-dose morphine as needed.  Constipation Noted on xray.  Will give suppository and stool softeners.  Palliative medicine Patient is failing to thrive and shutting down. Family appears to understand this. Patient is DNR/DNI. Daughter who was at bedside stated she needed to feel as though she had done everything she was supposed to do and was not quite ready to accept solely comfort care. Great granddaughter Leonidas Romberg, RN seems to understand that the patient is ready for comfort care and probably hospice. Palliative medicine has been consulted for goals of care. Social work has been consulted to engage hospice at  discharge. Patient should not return to the hospital    Consultants Called:  Palliative medicine  Family Communication:   Daughter and great granddaughter Leonidas Romberg, RN ) at bedside  Code Status:  DO NOT RESUSCITATE/DO NOT  INTUBATE  Condition:  Guarded. Poor prognosis  Potential Disposition: Home with hospice versus residential hospice in 48-72 hours.  Time spent in minutes : [address]  PA-C on 10/17/2014 at 12:31 PM Between 7am to 7pm - Pager - [phone] After 7pm go to [email or web address] - password [email or web address] And look for the night coverage person covering me after hours  [facility]

## 2014-10-17 NOTE — ED Notes (Signed)
MD at bedside. 

## 2014-10-17 NOTE — Progress Notes (Signed)
Admission note:  Arrival Method: Patient arrived to unit on bed with staff and daughter accompanying her. Mental Orientation:  Alert and oriented. Telemetry: Tele 6E01, SB, 46 Assessment: See doc flow sheets. Skin: dry, warm and intact, except for deep tissue injury on the bottom which is purplish, not open, put foam dressing.  Multiple bruising sites noted on the both upper extremities.  PUt foam dressing on both ankles due to redness but blanchable.  Skin tear noted on the left leg which is covered with foam dressing. IV: Left AC, patent infusing NS 50 ml/hr. Pain: denies any pain, only when she moves. Tubes: N/A Safety Measures: Bed in low position, DNR band, phone and call light within reach. Admission Screening: In progress. 6700 Orientation: Patient has been oriented to the unit, staff and to the room.

## 2014-10-17 NOTE — ED Notes (Signed)
Decubitis ulcer noted to sacrum.

## 2014-10-17 NOTE — Clinical Social Work Note (Signed)
CSW received consult for residential hospice placement. CSW talked with patient's daughter Audria Nine at the bedside and patient's great-granddaughter Nira Conn regarding hospice placement and was given their preferences: [facility] and [facility]nd choice [facility].  Contact made with [place], Erling Conte and referral made.  Emberlynn Riggan Givens, MSW, LCSW Licensed Clinical Social Worker [place] [phone]

## 2014-10-17 NOTE — ED Notes (Signed)
Pt arrives from home via POV. Pt states she has been extremely fatigued and has been unable to eat. Pt states she "just doesn't want food'. Pt daughter endorses pt didn't speak for a couple of days. Pt states she feels so tired she doesn't feel like she can do anything.

## 2014-10-17 NOTE — ED Provider Notes (Signed)
CSN: [id number]     Arrival date & time 10/17/14  1245 History   First MD Initiated Contact with Patient 10/17/14 0913     Chief Complaint  Patient presents with  . Fatigue  . Anorexia   LEVEL 5 CAVEAT FOR ALTERED MENTAL STATUS Patient is a 81 y.o. female presenting with weakness. The history is provided by the patient. The history is limited by the condition of the patient.  Weakness This is a new problem. The current episode started more than 1 week ago. The problem occurs daily. The problem has been gradually worsening. Pertinent negatives include no chest pain and no shortness of breath. Nothing aggravates the symptoms. Nothing relieves the symptoms.  PT PRESENTS FROM HOME FOR INCREASED WEAKNESS, ANOREXIA  THIS HAS BEEN PRESENT FOR AWHILE BUT WORSE IN PAST SEVERAL DAYS NO OTHER DETAILS KNOWN AT ARRIVAL  Past Medical History  Diagnosis Date  . Hypertension   . Atrial fibrillation    Past Surgical History  Procedure Laterality Date  . Breast lumpectomy    . Knee surgery    . Breast lumpectomy     Family History  Problem Relation Age of Onset  . Diabetes Sister   . Heart disease Sister    History  Substance Use Topics  . Smoking status: Never Smoker   . Smokeless tobacco: Never Used  . Alcohol Use: No   OB History    No data available     Review of Systems  Unable to perform ROS: Mental status change  Respiratory: Negative for shortness of breath.   Cardiovascular: Negative for chest pain.  Neurological: Positive for weakness.      Allergies  Tramadol and Neurontin  Home Medications   Prior to Admission medications   Medication Sig Start Date End Date Taking? Authorizing Provider  carvedilol (COREG) 6.25 MG tablet Take 6.25 mg by mouth 2 (two) times daily with a meal.    Historical Provider, MD  COLCRYS 0.6 MG tablet Take 1 tablet by mouth as needed. 11/11/11   Historical Provider, MD  diltiazem (CARDIZEM CD) 120 MG 24 hr capsule Take 1 tablet by mouth  Daily. 12/21/11   Historical Provider, MD  food thickener ([employer]) POWD Take by mouth as needed.    Historical Provider, MD  furosemide (LASIX) 40 MG tablet Take 40 mg by mouth daily.    Historical Provider, MD  losartan (COZAAR) 100 MG tablet Take 100 mg by mouth daily.    Historical Provider, MD  oxyCODONE-acetaminophen (PERCOCET/ROXICET) 5-325 MG per tablet Take 1 tablet by mouth every 4 (four) hours as needed (not more than 5 per day). 09/03/14   Bayard Hugger, NP  pantoprazole (PROTONIX) 40 MG tablet Take 40 mg by mouth 2 (two) times daily.    Historical Provider, MD  polyethylene glycol (MIRALAX / GLYCOLAX) packet Take by mouth daily.    Historical Provider, MD  warfarin (COUMADIN) 2 MG tablet Take 2 mg by mouth daily. 1 TO 1.5 DAILY AS DIRECTED    Historical Provider, MD   BP 130/80 mmHg  Pulse 45  Temp(Src) 97.6 F (36.4 C) (Oral)  Resp 21  SpO2 96% Physical Exam CONSTITUTIONAL: elderly, frail HEAD: Normocephalic/atraumatic EYES: EOMI ENMT: Mucous membranes moist NECK: supple no meningeal signs SPINE/BACK:entire spine nontender CV: bradycardic, murmur, irregular rhythm noted LUNGS: Lungs are clear to auscultation bilaterally, no apparent distress ABDOMEN: soft, nontender, no rebound or guarding, bowel sounds noted throughout abdomen GU:no cva tenderness NEURO: Pt is drowsy but easily arousable.  She is aware of name/birthdate/location but unable to recall year.  No facial droop.  No obvious extremity drift is noted. EXTREMITIES: pulses normal/equal, full ROM SKIN: warm, color normal. Area of erythema to sacrum, likely stage 1 ulcer PSYCH: unable to assess  ED Course  Procedures  9:27 AM Pt here with weakness/fatigue Workup started She is noted to be in slow afib Will monitor 9:46 AM Daughter reports for past several days pt has had increased fatigue and lack of appetite Also reported abd pain Currently no focal tenderness but is drowsy (daughter reports she just  had oxycodone at 730am) [facility] imaging ordered 10:42 AM Pt somnolent but arousable abd soft on repeat exam Awaiting labs 11:56 AM Pt with acute renal failure Will admit for dehydration/monitoring Pt more awake at this time She has transient episodes of bradycardia that resolve D/w triad [employer] will admit to tele Patient/family updated  Labs Review Labs Reviewed  PROTIME-INR - Abnormal; Notable for the following:    Prothrombin Time 38.6 (*)    INR 4.08 (*)    All other components within normal limits  CBC - Abnormal; Notable for the following:    Hemoglobin 10.6 (*)    HCT 34.6 (*)    RDW 17.8 (*)    Platelets 103 (*)    All other components within normal limits  DIFFERENTIAL - Abnormal; Notable for the following:    Neutrophils Relative % 91 (*)    Neutro Abs 7.8 (*)    Lymphocytes Relative 3 (*)    Lymphs Abs 0.3 (*)    All other components within normal limits  URINE RAPID DRUG SCREEN, HOSP PERFORMED - Abnormal; Notable for the following:    Opiates POSITIVE (*)    All other components within normal limits  COMPREHENSIVE METABOLIC PANEL - Abnormal; Notable for the following:    Chloride 94 (*)    CO2 33 (*)    Glucose, Bld 114 (*)    BUN 94 (*)    Creatinine, Ser 2.52 (*)    Total Protein 5.2 (*)    Albumin 2.8 (*)    AST 14 (*)    GFR calc non Af Amer 15 (*)    GFR calc Af Amer 18 (*)    All other components within normal limits  TROPONIN I - Abnormal; Notable for the following:    Troponin I 0.05 (*)    All other components within normal limits  ETHANOL  URINALYSIS, ROUTINE W REFLEX MICROSCOPIC (NOT AT [facility])    Imaging Review Ct Head Wo Contrast  10/17/2014   CLINICAL DATA:  Weakness, unable to eat  EXAM: CT HEAD WITHOUT CONTRAST  TECHNIQUE: Contiguous axial images were obtained from the base of the skull through the vertex without intravenous contrast.  COMPARISON:  07/05/2009  FINDINGS: No skull fracture is noted. Paranasal sinuses and mastoid air  cells are unremarkable. Moderate cerebral atrophy. Mild periventricular white matter decreased attenuation probable due to chronic small vessel ischemic changes. Atherosclerotic calcification of carotid siphon. No acute cortical infarction. No mass lesion is noted on this unenhanced scan.  IMPRESSION: No acute intracranial abnormality. Moderate cerebral atrophy. Mild periventricular white matter decreased attenuation probable due to chronic small vessel ischemic changes. No definite acute cortical infarction.   Electronically Signed   By: Lahoma Crocker M.D.   On: 10/17/2014 10:08   Dg Abd Acute W/chest  10/17/2014   CLINICAL DATA:  Abdominal pain and weakness  EXAM: DG ABDOMEN ACUTE W/ 1V CHEST  COMPARISON:  Abdominal radiograph June 19, 2010; chest radiograph April 23 04/19/2011  FINDINGS: PA chest: There is no edema or consolidation. Heart is mildly enlarged with pulmonary vascularity within normal limits. Prominence the right peritracheal region most likely represents great vessel prominence in this age group. The aorta is prominent and tortuous with extensive atherosclerotic calcification, stable. There is extensive arthropathy in both shoulders with chronic avascular necrosis in each humeral head.  Supine and left lateral decubitus abdomen: There is diffuse stool throughout the colon. There is no bowel dilatation or air-fluid level suggesting obstruction. No free air. Extensive arterial vascular calcification present. There is lumbar scoliosis with degenerative type change. Bones are diffusely osteoporotic.  IMPRESSION: Diffuse stool throughout colon. Bowel gas pattern unremarkable. No obstruction or free air. No lung edema or consolidation. Stable cardiomegaly. Extensive atherosclerotic calcification. 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   Electronically Signed   By: Lowella Grip III M.D.   On: 10/17/2014 10:18     EKG Interpretation   Date/Time:  Wednesday October 17 2014 09:14:04  EDT Ventricular Rate:  53 PR Interval:    QRS Duration: 105 QT Interval:  468 QTC Calculation: 439 R Axis:   125 Text Interpretation:  Atrial fibrillation Right axis deviation Nonspecific  T abnormalities, lateral leads rate is slower on today's ekg Confirmed by  Christy Gentles  MD, Elenore Rota ([id number]) on 10/17/2014 9:24:58 AM      MDM   Final diagnoses:  AKI (acute kidney injury)  Delirium  Dehydration  Chronic atrial fibrillation  Supratherapeutic INR  Constipation, unspecified constipation type    Nursing notes including past medical history and social history reviewed and considered in documentation Labs/vital reviewed myself and considered during evaluation xrays/imaging reviewed by myself and considered during evaluation     Ripley Fraise, MD 10/17/14 1157

## 2014-10-17 NOTE — ED Notes (Signed)
Pt called this RN into room, states she cannot breath, pt is tachypneic however O2 sats remain at 100% on 2L O2 nasal cannula. Admitting has been paged.

## 2014-10-18 DIAGNOSIS — Z515 Encounter for palliative care: Secondary | ICD-10-CM

## 2014-10-18 DIAGNOSIS — N179 Acute kidney failure, unspecified: Principal | ICD-10-CM

## 2014-10-18 LAB — BASIC METABOLIC PANEL
Anion gap: 8 (ref 5–15)
BUN: 93 mg/dL — AB (ref 6–20)
CHLORIDE: 100 mmol/L — AB (ref 101–111)
CO2: 32 mmol/L (ref 22–32)
CREATININE: 2.31 mg/dL — AB (ref 0.44–1.00)
Calcium: 8.8 mg/dL — ABNORMAL LOW (ref 8.9–10.3)
GFR calc Af Amer: 20 mL/min — ABNORMAL LOW (ref >60)
GFR, EST NON AFRICAN AMERICAN: 17 mL/min — AB (ref >60)
Glucose, Bld: 99 mg/dL (ref 65–99)
Potassium: 4.4 mmol/L (ref 3.5–5.1)
Sodium: 140 mmol/L (ref 135–145)

## 2014-10-18 LAB — T4, FREE: Free T4: 0.9 ng/dL (ref 0.61–1.12)

## 2014-10-18 LAB — PROTIME-INR
INR: 3.35 — ABNORMAL HIGH (ref 0.00–1.49)
Prothrombin Time: 33.3 seconds — ABNORMAL HIGH (ref 11.6–15.2)

## 2014-10-18 LAB — TSH: TSH: 0.791 u[IU]/mL (ref 0.350–4.500)

## 2014-10-18 MED ORDER — SENNOSIDES-DOCUSATE SODIUM 8.6-50 MG PO TABS
3.0000 | ORAL_TABLET | Freq: Two times a day (BID) | ORAL | Status: DC
  Administered 2014-10-18 – 2014-10-19 (×3): 3 via ORAL
  Filled 2014-10-18 (×4): qty 3

## 2014-10-18 MED ORDER — POLYETHYLENE GLYCOL 3350 17 G PO PACK
17.0000 g | PACK | Freq: Two times a day (BID) | ORAL | Status: DC
  Administered 2014-10-18 – 2014-10-19 (×3): 17 g via ORAL
  Filled 2014-10-18 (×4): qty 1

## 2014-10-18 NOTE — Progress Notes (Signed)
Initial Nutrition Assessment  DOCUMENTATION CODES:   Severe malnutrition in context of chronic illness  INTERVENTION:   Continue Ensure Enlive po TID (thickened to nectar thick), each supplement provides 350 kcal and 20 grams of protein.  Encourage adequate PO intake.   NUTRITION DIAGNOSIS:   Malnutrition related to chronic illness as evidenced by severe depletion of body fat, severe depletion of muscle mass.  GOAL:   Patient will meet greater than or equal to 90% of their needs  MONITOR:   PO intake, Supplement acceptance, Weight trends, Labs, I & O's  REASON FOR ASSESSMENT:   Consult  (FTT)  ASSESSMENT:   81 yo female with h/o severe aortic stenosis, CKD 4 now with urinary retention, a-fib on coumadin, HTN. She was admitted with worseing PO intake, AMS, increased weakness. She had an elevated BUN as well as creatinine in ED.  Current meal completion has been 25%. Family at bedside. They report pt usually consumes 2 meals a day with Ensure BID, however the 4 days PTA pt had a lack of appetite with no po intake. Usual body weight reported to be 104 lbs, last weighing in April 2016. Weight loss not found significant. Pt currently has Ensure ordered. RD to continue with current orders. Per palliative note, pt would not like to pursue feeding tube or HD.   Nutrition-Focused physical exam completed. Findings are severe fat depletion, moderate to severe muscle depletion, and no edema.   Labs: Low chloride, calcium, and GFR. High BUN and creatinine.   Diet Order:  DIET DYS 2 Room service appropriate?: Yes; Fluid consistency:: Nectar Thick  Skin:  Wound (see comment) (DTI on buttocks)  Last BM:  7/20  Height:   Ht Readings from Last 1 Encounters:  10/17/14 4\' 11"  (1.499 m)    Weight:   Wt Readings from Last 1 Encounters:  10/17/14 101 lb 12.8 oz (46.176 kg)    Ideal Body Weight:  44.5 kg  Wt Readings from Last 10 Encounters:  10/17/14 101 lb 12.8 oz (46.176 kg)   03/05/14 110 lb 6.4 oz (50.077 kg)  02/05/14 116 lb (52.617 kg)  01/03/14 113 lb (51.256 kg)  12/05/13 115 lb (52.164 kg)  11/02/13 105 lb (47.628 kg)  10/02/13 110 lb (49.896 kg)  08/31/13 108 lb (48.988 kg)  08/02/13 108 lb 3.2 oz (49.079 kg)  07/04/13 111 lb (50.349 kg)    BMI:  Body mass index is 20.55 kg/(m^2).  Estimated Nutritional Needs:   Kcal:  1350-1600  Protein:  60-70 grams  Fluid:  Per MD  EDUCATION NEEDS:   No education needs identified at this time  Corrin Parker, MS, RD, LDN Pager # [phone] After hours/ weekend pager # [phone]

## 2014-10-18 NOTE — Consult Note (Signed)
Consultation Note Date: 10/18/2014   Patient Name: [name]  DOB: 1917/10/29  MRN: [id number]  Age / Sex: 81 y.o., female   PCP: Stephens Shire, MD Referring Physician: Reyne Dumas, MD  Reason for Consultation: Establishing goals of care  Palliative Care Assessment and Plan Summary of Established Goals of Care and Medical Treatment Preferences   Clinical Assessment/Narrative: Py is a 81 yo female admitted after worsening weakness, decreased LOC and no PO intake. She is more alert overnight after receivng IVF, and being placed on oxygen, although still lethargic. Met with daughter [name], with whom she lives. Per daughter, pt at baseline can pivot to [facility] but at times is telling her she feels too weak to sit on the side of the bed. She  has only been eating bites and sips now for weeks. She has been on thickened liquids for over a year because of high risk for aspiration. Daughter wants to take pt home, but concerned she is too weak for her to manage in the home if she cannot get to [facility]. Daughter has had 3 MI's. Consult was placed to for in-patient hospice but pt appears improved overnight. We did discuss that this fluctuating clinical picture cold be part of an on-going process especially in light of her decreased PO intake, and new dyspnea. We discussed s/s of EOL, one of which is the natural trajectory to eat and drink less. Daughter verbalizes understanding. She is struggling with whether this is  EOL or if not will she be able to mange things at home. We discussed hospice in the home services, foley catheter to help pt conserve energy. Pt also has chronic pain and has been having to make monthly visits to pain clinic which in-home hospice could help with her pain mgt if she is now becoming too weak to make these doctor's appts. This am pt has not been able to void. Bladder scan shows 450 cc. [name] states her family feels that Mrs. Magallanes is near EOL and are encouraging her to go to [facility] if  she meets criteria. [name] has had a family member to die there.    Contacts/Participants in Discussion: Primary Decision Maker: Daughter, Glean Salvo   HCPOA: yes Pt and daughter present  Code Status/Advance Care Planning:  DNR  Would not pursue HD in setting of worsening renal function  Would not pursue feeding tube  Would want to cont coumadin as long as appropriate with lab monitoring  Does not want her to die in the home  Get PT eval to ascertain pt's ability to transfer at this point  Care mgt as well as SW consults in place to ascertain disposition  Symptom Management:   Chronic pain secondary to OA: Cont prn percocet. Pt takes anywhere from 2-5 tabs daily  Constipation: Will add senna s 3 bid which is what pt is on at home  Additional Recommendations (Limitations, Scope, Preferences):  n/a Psycho-social/Spiritual:   Support System: yes  Desire for further Chaplaincy support:no  Prognosis: Difficult to say at this point as she is much improved from her admission level but clearly frail , FTT picture in setting of severe aortic stenosis, now with worsening dyspnea, requiring oxygen. She definitely mets hospice in-home criteria but next few days maybe more telling if she is able to maintain improved LOC  Discharge Planning:  Undecided at this point. Waiting to see how she does with PT whether she can go home with hospice support or if she continues  to decline meets in-patient hospice criteria.       Chief Complaint/History of Present Illness: pt is a 81 yo female with h/o severe aortic stenosis, CKD 4 now with urinary retention, a-fib on coumadin, HTN. She was admitted with worseing PO intake, AMS, increased weakness. She had an elevated BUN as well as creatinine in ED.  Primary Diagnoses  Present on Admission:  . AKI (acute kidney injury) . Cervical spondylosis without myelopathy . Chronic pain of multiple joints . Bradycardia . Oliguria . Sacral ulcer .  Acute kidney failure  Palliative Review of Systems: Too dyspneic. She does deny pain. States she is " a little short of breath" just in the bed I have reviewed the medical record, interviewed the patient and family, and examined the patient. The following aspects are pertinent.  Past Medical History  Diagnosis Date  . Hypertension   . Atrial fibrillation   . Skin cancer     "burned off her arms and things"  . Hypercholesterolemia   . Heart murmur   . CHF (congestive heart failure)   . Shortness of breath dyspnea     "all the time" (10/17/2014)  . GERD (gastroesophageal reflux disease)   . History of hiatal hernia   . Arthritis     "eat up w/it" (10/17/2014)  . Gout   . Chronic kidney disease (CKD), stage IV (severe)    History   Social History  . Marital Status: Widowed    Spouse Name: N/A  . Number of Children: N/A  . Years of Education: N/A   Social History Main Topics  . Smoking status: Never Smoker   . Smokeless tobacco: Never Used  . Alcohol Use: No  . Drug Use: No  . Sexual Activity: Not on file   Other Topics Concern  . None   Social History Narrative   Family History  Problem Relation Age of Onset  . Diabetes Sister   . Heart disease Sister    Scheduled Meds: . feeding supplement (ENSURE ENLIVE)  237 mL Oral TID BM  . pantoprazole  40 mg Oral Daily  . senna-docusate  3 tablet Oral BID  . sodium chloride  3 mL Intravenous Q12H  . Warfarin - Pharmacist Dosing Inpatient   Does not apply q1800   Continuous Infusions:  PRN Meds:.acetaminophen **OR** acetaminophen, alum & mag hydroxide-simeth, LORazepam, morphine injection, ondansetron **OR** ondansetron (ZOFRAN) IV, oxyCODONE-acetaminophen, RESOURCE THICKENUP CLEAR Medications Prior to Admission:  Prior to Admission medications   Medication Sig Start Date End Date Taking? Authorizing Provider  carvedilol (COREG) 6.25 MG tablet Take 6.25 mg by mouth 2 (two) times daily with a meal.   Yes Historical  Provider, MD  COLCRYS 0.6 MG tablet Take 1 tablet by mouth as needed. 11/11/11  Yes Historical Provider, MD  diltiazem (CARDIZEM CD) 120 MG 24 hr capsule Take 1 tablet by mouth Daily. 12/21/11  Yes Historical Provider, MD  food thickener ([employer]) POWD Take by mouth as needed.   Yes Historical Provider, MD  furosemide (LASIX) 40 MG tablet Take 40 mg by mouth daily.   Yes Historical Provider, MD  losartan (COZAAR) 100 MG tablet Take 100 mg by mouth daily.   Yes Historical Provider, MD  oxyCODONE-acetaminophen (PERCOCET/ROXICET) 5-325 MG per tablet Take 1 tablet by mouth every 4 (four) hours as needed (not more than 5 per day). 09/03/14  Yes Bayard Hugger, NP  pantoprazole (PROTONIX) 40 MG tablet Take 40 mg by mouth 2 (two) times daily.  Yes Historical Provider, MD  senna-docusate (SENNA PLUS) 8.6-50 MG per tablet Take 2-3 tablets by mouth 2 (two) times daily. 3 tablets in the morning and 2 tablets in the evening   Yes Historical Provider, MD  warfarin (COUMADIN) 2 MG tablet Take 2-3 mg by mouth daily. 70m on Monday Wednesday Friday, 344mon _0 /20/16  0945  10/17/14  1019  10/18/14  0447  WBC  8.6   --    --   HGB  10.6*   --    --   PLT  103*   --    --   NA   --   136  140  BUN   --   94*  93*  CREATININE   --   2.52*  2.31*     Time In: 0845 Time Out: 0930 Time Total: 75 min Greater than 50%  of this time was spent counseling and coordinating care related to the above assessment and plan.  Signed by: SaDory HornNP  SaDory HornNP  10/18/2014, 9:50 AM  Please contact Palliative Medicine Team phone at [phone] questions and concerns.

## 2014-10-18 NOTE — Evaluation (Signed)
Clinical/Bedside Swallow Evaluation Patient Details  Name: [name] MRN: [id number] Date of Birth: [birth year]  Today's Date: 10/18/2014 Time: SLP Start Time (ACUTE ONLY): 0930 SLP Stop Time (ACUTE ONLY): 0950 SLP Time Calculation (min) (ACUTE ONLY): 20 min  Past Medical History:  Past Medical History  Diagnosis Date  . Hypertension   . Atrial fibrillation   . Skin cancer     "burned off her arms and things"  . Hypercholesterolemia   . Heart murmur   . CHF (congestive heart failure)   . Shortness of breath dyspnea     "all the time" (10/17/2014)  . GERD (gastroesophageal reflux disease)   . History of hiatal hernia   . Arthritis     "eat up w/it" (10/17/2014)  . Gout   . Chronic kidney disease (CKD), stage IV (severe)    Past Surgical History:  Past Surgical History  Procedure Laterality Date  . Breast lumpectomy Left   . Total knee arthroplasty Left   . Joint replacement    . Cataract extraction w/ intraocular lens  implant, bilateral Bilateral    HPI:  [name] is a 81 y.o. female with atrial fibrillation, hypertension, severe aortic stenosis presenting with who presents with anorexia, failure to thrive. Per daughter at the bedside reports that patient has not had much to eat in the last several days. Patient is also constipated. MD reports pt has known dysphagia and is aspirating secretions. On esophagram last year pt seen to ahve laryngeal penetration without aspiration and poor esophageal motility.  Family is considering hospice care at d/c but has not requested full comfort measures at this time. Requested SLP recommend best diet.    Assessment / Plan / Recommendation Clinical Impression  Pt is tolerating current diet as best as can be expected in setting of oropharyngeal and esophageal dysphagia. Pt does have some delayed throat clearing and cough, but no immediate signs of aspiration or discomfort. Discussed thickening and aspiration precautions with pts  daughter, which she is already doing well at home. Also discussed progression of dysphagia and need to allow pt to eat quantities she feels comfortable with to avoid reflux and aspiration. Recommend pt continue current dys 2/nectar thick diet. No SLP f/u needed as education is complete.     Aspiration Risk  Moderate    Diet Recommendation Dysphagia 2 (Fine chop);Nectar   Medication Administration: Other (Comment) (per dtr. ) Compensations: Slow rate;Small sips/bites    Other  Recommendations Oral Care Recommendations: Oral care BID   Follow Up Recommendations       Frequency and Duration        Pertinent Vitals/Pain NA    SLP Swallow Goals     Swallow Study Prior Functional Status       General Other Pertinent Information: [name] is a 81 y.o. female with atrial fibrillation, hypertension, severe aortic stenosis presenting with who presents with anorexia, failure to thrive. Per daughter at the bedside reports that patient has not had much to eat in the last several days. Patient is also constipated. MD reports pt has known dysphagia and is aspirating secretions. On esophagram last year pt seen to ahve laryngeal penetration without aspiration and poor esophageal motility.  Family is considering hospice care at d/c but has not requested full comfort measures at this time. Requested SLP recommend best diet.  Type of Study: Bedside swallow evaluation Previous Swallow Assessment: esophagram 2015, has been on nectar thick liquids since them Diet Prior to this  Study: Dysphagia 2 (chopped);Nectar-thick liquids Temperature Spikes Noted: No Respiratory Status: Room air History of Recent Intubation: No Behavior/Cognition: Alert;Cooperative;Pleasant mood Oral Cavity - Dentition: Adequate natural dentition/normal for age Self-Feeding Abilities: Able to feed self Patient Positioning: Upright in bed Baseline Vocal Quality: Normal Volitional Cough: Strong Volitional Swallow: Able to  elicit    Oral/Motor/Sensory Function Overall Oral Motor/Sensory Function: Appears within functional limits for tasks assessed   Ice Chips     Thin Liquid Thin Liquid: Not tested    Nectar Thick Nectar Thick Liquid: Impaired Presentation: Cup;Self Fed Pharyngeal Phase Impairments: Throat Clearing - Delayed;Cough - Delayed   Honey Thick Honey Thick Liquid: Not tested   Puree Puree: Not tested   Solid   GO    Solid: Impaired Presentation: Spoon Pharyngeal Phase Impairments: Throat Clearing - Delayed;Cough - Delayed       [name], Katherene Ponto 10/18/2014,10:07 AM

## 2014-10-18 NOTE — Progress Notes (Signed)
[facility] PROGRESS NOTE  [name] KWI:[id number] DOB: March 28, 1918 DOA: 10/17/2014 PCP: Stephens Shire, MD  Assessment/Plan: Principal Problem:   AKI (acute kidney injury) Active Problems:   Cervical spondylosis without myelopathy   Chronic pain of multiple joints   Aortic stenosis, severe   Bradycardia   Oliguria   Sacral ulcer   Acute kidney failure   Palliative care encounter     Acute kidney injury in the setting of stage 3-4 chronic kidney disease. Creatinine has slowly trended up over the years, suspected that baseline is around 2.0 The last recorded BUN/creatinine in Epic was in 2012 (40/1.61).  Some improvement after hydration overnight Patient also has urinary retention, creatinine may improve further after placement of Foley   She is not eating/drinking, getting out of bed or talking much at all. Will carefully give IV fluids and hold any nephrotoxic medications such as Lasix, Cozaar, Colcrys.  Failure to thrive Multifactorial secondary to comorbidities including severe aortic stenosis History of dysphagia, speech therapy recommends  Dysphagia 2 (Fine chop);Nectar thick  Atrial fibrillation with Bradycardia currently (36-58). In the setting of severe aortic stenosis Patient regularly sees Dr. Wynonia Lawman -admitting provider called him for any recommendations. He reports she is essentially bed bound with severe chronic pain and has a DNR order. He recommends stopping the Toprol and Diltiazem, but no further work up for AS or Bradycardia would be appropriate given her age and status. Continue Coumadin per pharmacy.  Family wishes to continue with Coumadin as per palliative care discussion  Constipation Patient has been started on Senokot, miralax  You also need to rule out impaction  Urinary retention Foley catheter will be placed   Palliative care goals  DNR  Would not pursue HD in setting of worsening renal function  Would not pursue feeding  tube  Would want to cont coumadin as long as appropriate with lab monitoring  Does not want her to die in the home  Get PT eval to ascertain pt's ability to transfer at this point  Care mgt as well as SW consults in place to ascertain disposition   Sacral Ulceration Wound consultation requested.  Chronic pain Continue oral pain medications. Supplement with Tylenol and very low-dose morphine as needed.      Code Status:      Code Status Orders        Start     Ordered   10/17/14 1459  Do not attempt resuscitation (DNR)   Continuous    Question Answer Comment                  10/17/14 1459      Family Communication: family updated about patient's clinical progress Disposition Plan:  As above    Brief narrative: 81 y.o. female, with atrial fibrillation, hypertension, and severe aortic stenosis. She presents to the emergency department today. After 3 days of not eating or drinking. She has stayed in bed and rarely spoken. Her daughter reports that she has not urinated for several days. She discussed this with her PCP yesterday who advised giving the patient 40 mg of Lasix orally. When the patient did urinate it was dark brown. The daughter reports the patient has barely eaten. Food appears to nauseate her. The patient now speaks very little. She appears to be shutting down.  In the emergency department the patient was found to have a significantly elevated BUN and creatinine. She has significant stool burden on x-ray. She is  somnolent. I discussed the natural process of dying with her daughter. Her daughter understands but wants her mother to receive IV fluids and treatment at this point. The daughter and great granddaughter indicate that they believe the patient would not want to return to the hospital after this admission. They are in agreement with engaging hospice  Consultants:  Palliative care  Procedures:  None  Antibiotics: Anti-infectives    None          HPI/Subjective: Lethargy has improved, complaining of chronic generalized pain, constipation, urinary retention patient has been unable to void.  Objective: Filed Vitals:   10/17/14 1816 10/17/14 2224 10/18/14 0433 10/18/14 0955  BP: 125/84 138/77 146/76 125/99  Pulse: 64 57 68 73  Temp: 98.1 F (36.7 C) 97.5 F (36.4 C) 97.4 F (36.3 C) 98 F (36.7 C)  TempSrc: Oral Oral Axillary Oral  Resp: 18 14 16 18   Height:  4\' 11"  (1.499 m)    Weight:  46.176 kg (101 lb 12.8 oz)    SpO2: 100% 100% 100% 98%    Intake/Output Summary (Last 24 hours) at 10/18/14 1122 Last data filed at 10/18/14 0600  Gross per 24 hour  Intake    550 ml  Output      0 ml  Net    550 ml    Exam:  Gen: Alert and oriented to self, NAD, frail and debilitated HEENT: neck supple, no JVD CVS: S1 and S2 clear, 3-4/6 SEM Chest: clear to auscultation bilaterally, no wheezing rales or rhonchi Abdomen: Soft, nontender,distended, normal bowel sounds Extremities: No cyanosis clubbing or edema noted Neuro: Cranial nerves II-12 intact, strength 5/5 in upper and lower extremeties bilaterally Skin: No rashes Psych: Alert and oriented to self normal affect  Data Review   Micro Results No results found for this or any previous visit (from the past 240 hour(s)).  Radiology Reports Ct Head Wo Contrast  10/17/2014   CLINICAL DATA:  Weakness, unable to eat  EXAM: CT HEAD WITHOUT CONTRAST  TECHNIQUE: Contiguous axial images were obtained from the base of the skull through the vertex without intravenous contrast.  COMPARISON:  07/05/2009  FINDINGS: No skull fracture is noted. Paranasal sinuses and mastoid air cells are unremarkable. Moderate cerebral atrophy. Mild periventricular white matter decreased attenuation probable due to chronic small vessel ischemic changes. Atherosclerotic calcification of carotid siphon. No acute cortical infarction. No mass lesion is noted on this unenhanced scan.  IMPRESSION: No acute  intracranial abnormality. Moderate cerebral atrophy. Mild periventricular white matter decreased attenuation probable due to chronic small vessel ischemic changes. No definite acute cortical infarction.   Electronically Signed   By: Lahoma Crocker M.D.   On: 10/17/2014 10:08   Dg Abd Acute W/chest  10/17/2014   CLINICAL DATA:  Abdominal pain and weakness  EXAM: DG ABDOMEN ACUTE W/ 1V CHEST  COMPARISON:  Abdominal radiograph June 19, 2010; chest radiograph April 23 04/19/2011  FINDINGS: PA chest: There is no edema or consolidation. Heart is mildly enlarged with pulmonary vascularity within normal limits. Prominence the right peritracheal region most likely represents great vessel prominence in this age group. The aorta is prominent and tortuous with extensive atherosclerotic calcification, stable. There is extensive arthropathy in both shoulders with chronic avascular necrosis in each humeral head.  Supine and left lateral decubitus abdomen: There is diffuse stool throughout the colon. There is no bowel dilatation or air-fluid level suggesting obstruction. No free air. Extensive arterial vascular calcification present. There is lumbar scoliosis with degenerative  type change. Bones are diffusely osteoporotic.  IMPRESSION: Diffuse stool throughout colon. Bowel gas pattern unremarkable. No obstruction or free air. No lung edema or consolidation. Stable cardiomegaly. Extensive atherosclerotic calcification. 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   Electronically Signed   By: Lowella Grip III M.D.   On: 10/17/2014 10:18     CBC  Recent Labs Lab 10/17/14 0945  WBC 8.6  HGB 10.6*  HCT 34.6*  PLT 103*  MCV 88.5  MCH 27.1  MCHC 30.6  RDW 17.8*  LYMPHSABS 0.3*  MONOABS 0.5  EOSABS 0.0  BASOSABS 0.0    Chemistries   Recent Labs Lab 10/17/14 1019 10/18/14 0447  NA 136 140  K 4.5 4.4  CL 94* 100*  CO2 33* 32  GLUCOSE 114* 99  BUN 94* 93*  CREATININE 2.52* 2.31*  CALCIUM 8.9 8.8*   AST 14*  --   ALT 15  --   ALKPHOS 62  --   BILITOT 0.5  --    ------------------------------------------------------------------------------------------------------------------ estimated creatinine clearance is 10.2 mL/min (by C-G formula based on Cr of 2.31). ------------------------------------------------------------------------------------------------------------------ No results for input(s): HGBA1C in the last 72 hours. ------------------------------------------------------------------------------------------------------------------ No results for input(s): CHOL, HDL, LDLCALC, TRIG, CHOLHDL, LDLDIRECT in the last 72 hours. ------------------------------------------------------------------------------------------------------------------ No results for input(s): TSH, T4TOTAL, T3FREE, THYROIDAB in the last 72 hours.  Invalid input(s): FREET3 ------------------------------------------------------------------------------------------------------------------ No results for input(s): VITAMINB12, FOLATE, FERRITIN, TIBC, IRON, RETICCTPCT in the last 72 hours.  Coagulation profile  Recent Labs Lab 10/17/14 0945 10/18/14 0447  INR 4.08* 3.35*    No results for input(s): DDIMER in the last 72 hours.  Cardiac Enzymes  Recent Labs Lab 10/17/14 1019  TROPONINI 0.05*   ------------------------------------------------------------------------------------------------------------------ Invalid input(s): POCBNP   CBG: No results for input(s): GLUCAP in the last 168 hours.     Studies: Ct Head Wo Contrast  10/17/2014   CLINICAL DATA:  Weakness, unable to eat  EXAM: CT HEAD WITHOUT CONTRAST  TECHNIQUE: Contiguous axial images were obtained from the base of the skull through the vertex without intravenous contrast.  COMPARISON:  07/05/2009  FINDINGS: No skull fracture is noted. Paranasal sinuses and mastoid air cells are unremarkable. Moderate cerebral atrophy. Mild periventricular  white matter decreased attenuation probable due to chronic small vessel ischemic changes. Atherosclerotic calcification of carotid siphon. No acute cortical infarction. No mass lesion is noted on this unenhanced scan.  IMPRESSION: No acute intracranial abnormality. Moderate cerebral atrophy. Mild periventricular white matter decreased attenuation probable due to chronic small vessel ischemic changes. No definite acute cortical infarction.   Electronically Signed   By: Lahoma Crocker M.D.   On: 10/17/2014 10:08   Dg Abd Acute W/chest  10/17/2014   CLINICAL DATA:  Abdominal pain and weakness  EXAM: DG ABDOMEN ACUTE W/ 1V CHEST  COMPARISON:  Abdominal radiograph June 19, 2010; chest radiograph April 23 04/19/2011  FINDINGS: PA chest: There is no edema or consolidation. Heart is mildly enlarged with pulmonary vascularity within normal limits. Prominence the right peritracheal region most likely represents great vessel prominence in this age group. The aorta is prominent and tortuous with extensive atherosclerotic calcification, stable. There is extensive arthropathy in both shoulders with chronic avascular necrosis in each humeral head.  Supine and left lateral decubitus abdomen: There is diffuse stool throughout the colon. There is no bowel dilatation or air-fluid level suggesting obstruction. No free air. Extensive arterial vascular calcification present. There is lumbar scoliosis with degenerative type change. Bones are diffusely osteoporotic.  IMPRESSION: Diffuse stool  throughout colon. Bowel gas pattern unremarkable. No obstruction or free air. No lung edema or consolidation. Stable cardiomegaly. Extensive atherosclerotic calcification. 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   Electronically Signed   By: Lowella Grip III M.D.   On: 10/17/2014 10:18      No results found for: HGBA1C Lab Results  Component Value Date   CREATININE 2.31* 10/18/2014       Scheduled Meds: . feeding  supplement (ENSURE ENLIVE)  237 mL Oral TID BM  . pantoprazole  40 mg Oral Daily  . polyethylene glycol  17 g Oral BID  . senna-docusate  3 tablet Oral BID  . sodium chloride  3 mL Intravenous Q12H  . Warfarin - Pharmacist Dosing Inpatient   Does not apply q1800   Continuous Infusions:   Principal Problem:   AKI (acute kidney injury) Active Problems:   Cervical spondylosis without myelopathy   Chronic pain of multiple joints   Aortic stenosis, severe   Bradycardia   Oliguria   Sacral ulcer   Acute kidney failure   Palliative care encounter    Time spent: 39 minutes   [facility] Pager [phone]. If 7PM-7AM, please contact night-coverage at [email or web address], password [facility] 10/18/2014, 11:22 AM  LOS: 1 day

## 2014-10-18 NOTE — Progress Notes (Signed)
ANTICOAGULATION CONSULT NOTE -Follow up  Pharmacy Consult for Coumadin Indication: atrial fibrillation  Allergies  Allergen Reactions  . Tramadol Swelling    In legs  . Neurontin [Gabapentin]     EDEMA    Patient Measurements: Height: 4\' 11"  (149.9 cm) Weight: 101 lb 12.8 oz (46.176 kg) IBW/kg (Calculated) : 43.2  Vital Signs: Temp: 98 F (36.7 C) (07/21 0955) Temp Source: Oral (07/21 0955) BP: 125/99 mmHg (07/21 0955) Pulse Rate: 73 (07/21 0955)  Labs:  Recent Labs  10/17/14 0945 10/17/14 1019 10/18/14 0447  HGB 10.6*  --   --   HCT 34.6*  --   --   PLT 103*  --   --   LABPROT 38.6*  --  33.3*  INR 4.08*  --  3.35*  CREATININE  --  2.52* 2.31*  TROPONINI  --  0.05*  --     Estimated Creatinine Clearance: 10.2 mL/min (by C-G formula based on Cr of 2.31).   Medical History: Past Medical History  Diagnosis Date  . Hypertension   . Atrial fibrillation   . Skin cancer     "burned off her arms and things"  . Hypercholesterolemia   . Heart murmur   . CHF (congestive heart failure)   . Shortness of breath dyspnea     "all the time" (10/17/2014)  . GERD (gastroesophageal reflux disease)   . History of hiatal hernia   . Arthritis     "eat up w/it" (10/17/2014)  . Gout   . Chronic kidney disease (CKD), stage IV (severe)      Assessment: 81yo female on Coumadin PTA for Afib, presented to [facility] on 7/20 after 3 days of not eating or drinking.  Pt admitted with AKI w/ elevated BUN and SCr. SCr 2.31 today down from 2.52. INR today is 3.35, elevated but decreased from 4.08 on admission. INR supratherapeutic possibly in part due to poor PO intake.  No CBC today.  On admit 7/20 H&H 10.6/34.6, PLT 103. No bleeding noted.  Expect Coumadin requirements will likely be lower during times of poor PO intake. Failure to thrive-MD notes today that patient is not eating/drinking. Family wishes to continue with Coumadin as per palliative care discussion. SCDs ordered, pt refused & RN  educated patient. Coumadin dose held yesterday. Coumadin last taken on 7/19 prior to admission.   PTA dose: 2mg  MWF, 3mg  all other days  Goal of Therapy:  INR 2-3 Monitor platelets by anticoagulation protocol: Yes   Plan:  Hold Coumadin today -Monitor INR, CBC, s/sx of bleeding  Thank you for allowing pharmacy to be part of this patients care team. Nicole Cella, RPh Clinical Pharmacist Pager: [phone] 10/18/2014 1:44 PM

## 2014-10-18 NOTE — Progress Notes (Signed)
As per Primary M.D. Order to insert foley catheter is on standby in case it needed tonight.

## 2014-10-18 NOTE — Evaluation (Signed)
Physical Therapy Evaluation Patient Details Name: [name] MRN: [id number] DOB: Mar 23, 1918 Today's Date: 10/18/2014   History of Present Illness  81 yo female with onset of FTT and has sacral wound with plan to look at Palliative care.  Daughter planning to take home if pt independent for mobility  Clinical Impression  Pt was assisted to sit up and worked on gradually sidestepping to test endurance but very fatigued.  Her total walking distance was short and needs help with all mobility.  Will expect her transition to some inpt form of care as daughter states she wants to take her home but cannot provide any real assistance.  Daughter seems conflicted about follow up to her mother's care.    Follow Up Recommendations SNF    Equipment Recommendations  None recommended by PT    Recommendations for Other Services       Precautions / Restrictions Precautions Precautions: Fall (telemetry) Restrictions Weight Bearing Restrictions: No      Mobility  Bed Mobility Overal bed mobility: Needs Assistance Bed Mobility: Supine to Sit;Sit to Supine     Supine to sit: Mod assist Sit to supine: Max assist   General bed mobility comments: Has limited tolerance for movement, moves legs as a unit and assist under trunk both directions  Transfers Overall transfer level: Needs assistance Equipment used: Rolling walker (2 wheeled);1 person hand held assist Transfers: Sit to/from Stand Sit to Stand: Min assist         General transfer comment: bed slightly elevated to assist  Ambulation/Gait Ambulation/Gait assistance: Min assist Ambulation Distance (Feet): 3 Feet Assistive device: Rolling walker (2 wheeled) Gait Pattern/deviations: Step-to pattern;Wide base of support;Trunk flexed Gait velocity: slower Gait velocity interpretation: Below normal speed for age/gender General Gait Details: sidesteps as a transfer technique to get up to [facility] with walker  Stairs             Wheelchair Mobility    Modified Rankin (Stroke Patients Only)       Balance Overall balance assessment: Needs assistance Sitting-balance support: Feet supported Sitting balance-Leahy Scale: Fair   Postural control: Posterior lean Standing balance support: Bilateral upper extremity supported Standing balance-Leahy Scale: Poor                               Pertinent Vitals/Pain Pain Assessment: Faces Pain Score: 4  Faces Pain Scale: Hurts little more Pain Location: shoulders and knees Pain Intervention(s): Limited activity within patient's tolerance;Monitored during session;Repositioned;Premedicated before session    Home Living Family/patient expects to be discharged to:: Unsure Living Arrangements: Children                    Prior Function Level of Independence: Needs assistance   Gait / Transfers Assistance Needed: assistance with RW  ADL's / Homemaking Assistance Needed: daughter cares for house and cooks        Hand Dominance        Extremity/Trunk Assessment   Upper Extremity Assessment: Generalized weakness           Lower Extremity Assessment: Generalized weakness      Cervical / Trunk Assessment: Kyphotic  Communication   Communication: HOH  Cognition Arousal/Alertness: Awake/alert Behavior During Therapy: Flat affect Overall Cognitive Status: Within Functional Limits for tasks assessed                      General Comments General comments (skin  integrity, edema, etc.): Pt is appropriate for SNF placement if goal is to get her home versus palliative care.  Could not get a verbal committment from daughter to decide her final objective, is conflicted between home and ALF arrangement.  Pt can transfer well enough to be at ALF but needs significant help to get OOB.      Exercises        Assessment/Plan    PT Assessment Patient needs continued PT services  PT Diagnosis Generalized weakness   PT Problem  List Decreased strength;Decreased range of motion;Decreased activity tolerance;Decreased balance;Decreased mobility;Decreased knowledge of use of DME;Cardiopulmonary status limiting activity;Pain;Decreased coordination  PT Treatment Interventions DME instruction;Gait training;Functional mobility training;Therapeutic activities;Therapeutic exercise;Balance training;Neuromuscular re-education;Patient/family education   PT Goals (Current goals can be found in the Care Plan section) Acute Rehab PT Goals Patient Stated Goal: didn't state PT Goal Formulation: With patient/family Time For Goal Achievement: 11/01/14 Potential to Achieve Goals: Fair    Frequency Min 2X/week   Barriers to discharge Inaccessible home environment;Decreased caregiver support used RW and ramp to enter house and daughter is assisting     Co-evaluation               End of Session Equipment Utilized During Treatment: Gait belt;Oxygen Activity Tolerance: Patient tolerated treatment well;Patient limited by fatigue;Patient limited by lethargy Patient left: in bed;with call bell/phone within reach;with bed alarm set;with family/visitor present Nurse Communication: Mobility status         Time: 7342-8768 PT Time Calculation (min) (ACUTE ONLY): 29 min   Charges:   PT Evaluation $Initial PT Evaluation Tier I: 1 Procedure PT Treatments $Therapeutic Activity: 8-22 mins   PT G Codes:        Ramond Dial Nov 04, 2014, 1:11 PM   Mee Hives, PT MS Acute Rehab Dept. Number: [facility] [id number] and [facility] [phone]

## 2014-10-18 NOTE — Progress Notes (Signed)
Patient with no urine output, last output was last night at 1908.Performed bladder scan, 459 cc in the bladder.Patient denies feeling the urge to urinate and refused to be placed on bed pan.Finally convince her to try using  the bedpan.Dayshift RN and NT made aware. Text paged Dr. Tilden Fossa made aware. Osie Merkin, Wonda Cheng, Therapist, sports

## 2014-10-19 DIAGNOSIS — E43 Unspecified severe protein-calorie malnutrition: Secondary | ICD-10-CM | POA: Insufficient documentation

## 2014-10-19 LAB — CBC
HCT: 33 % — ABNORMAL LOW (ref 36.0–46.0)
Hemoglobin: 10 g/dL — ABNORMAL LOW (ref 12.0–15.0)
MCH: 26.2 pg (ref 26.0–34.0)
MCHC: 30.3 g/dL (ref 30.0–36.0)
MCV: 86.6 fL (ref 78.0–100.0)
Platelets: 116 10*3/uL — ABNORMAL LOW (ref 150–400)
RBC: 3.81 MIL/uL — ABNORMAL LOW (ref 3.87–5.11)
RDW: 17.3 % — AB (ref 11.5–15.5)
WBC: 5.1 10*3/uL (ref 4.0–10.5)

## 2014-10-19 LAB — COMPREHENSIVE METABOLIC PANEL
ALK PHOS: 55 U/L (ref 38–126)
ALT: 11 U/L — AB (ref 14–54)
AST: 12 U/L — ABNORMAL LOW (ref 15–41)
Albumin: 2.5 g/dL — ABNORMAL LOW (ref 3.5–5.0)
Anion gap: 9 (ref 5–15)
BUN: 93 mg/dL — AB (ref 6–20)
CO2: 31 mmol/L (ref 22–32)
Calcium: 8.7 mg/dL — ABNORMAL LOW (ref 8.9–10.3)
Chloride: 98 mmol/L — ABNORMAL LOW (ref 101–111)
Creatinine, Ser: 2.29 mg/dL — ABNORMAL HIGH (ref 0.44–1.00)
GFR, EST AFRICAN AMERICAN: 20 mL/min — AB (ref >60)
GFR, EST NON AFRICAN AMERICAN: 17 mL/min — AB (ref >60)
GLUCOSE: 104 mg/dL — AB (ref 65–99)
POTASSIUM: 4.7 mmol/L (ref 3.5–5.1)
Sodium: 138 mmol/L (ref 135–145)
TOTAL PROTEIN: 5 g/dL — AB (ref 6.5–8.1)
Total Bilirubin: 0.5 mg/dL (ref 0.3–1.2)

## 2014-10-19 LAB — PROTIME-INR
INR: 2.26 — ABNORMAL HIGH (ref 0.00–1.49)
Prothrombin Time: 24.7 seconds — ABNORMAL HIGH (ref 11.6–15.2)

## 2014-10-19 MED ORDER — ENSURE ENLIVE PO LIQD: 237.0000 mL | Freq: Three times a day (TID) | ORAL | Status: AC

## 2014-10-19 MED ORDER — ONDANSETRON HCL 4 MG PO TABS: 4.0000 mg | ORAL_TABLET | Freq: Four times a day (QID) | ORAL | Status: AC | PRN

## 2014-10-19 MED ORDER — POLYETHYLENE GLYCOL 3350 17 G PO PACK: 17.0000 g | PACK | Freq: Two times a day (BID) | ORAL | Status: AC

## 2014-10-19 MED ORDER — OXYCODONE-ACETAMINOPHEN 5-325 MG PO TABS: 1.0000 | ORAL_TABLET | ORAL | Status: AC | PRN

## 2014-10-19 MED ORDER — WARFARIN SODIUM 2 MG PO TABS
2.0000 mg | ORAL_TABLET | Freq: Once | ORAL | Status: DC
Start: 2014-10-19 — End: 2014-10-19
  Filled 2014-10-19: qty 1

## 2014-10-19 MED ORDER — MORPHINE SULFATE (CONCENTRATE) 10 MG/0.5ML PO SOLN: 5.0000 mg | ORAL | Status: AC | PRN

## 2014-10-19 MED ORDER — LORAZEPAM 2 MG/ML PO CONC: 1.0000 mg | Freq: Four times a day (QID) | ORAL | Status: AC | PRN

## 2014-10-19 NOTE — Clinical Social Work Note (Signed)
Patient will discharge to [facility] facility today. Ambulance transport arranged for 1 pm pick-up. Family has completed Hospice admissions paperwork and is aware of ambulance transport. CSW signing offl.  Makylah Bossard Givens, MSW, LCSW Licensed Clinical Social Worker [place] [phone]

## 2014-10-19 NOTE — Consult Note (Signed)
Wake room available for patient today. Family has completed paper work. Dr. Orpah Melter to assume care per family preference.   Please fax discharge summary to [phone].  RN please call report to [phone].  Thank you. Erling Conte LCSW [phone]

## 2014-10-19 NOTE — Progress Notes (Signed)
ANTICOAGULATION CONSULT NOTE - Follow Up Consult  Pharmacy Consult for Coumadin Indication: atrial fibrillation  Allergies  Allergen Reactions  . Tramadol Swelling    In legs  . Neurontin [Gabapentin]     EDEMA    Patient Measurements: Height: 4\' 11"  (149.9 cm) Weight: 101 lb 12.8 oz (46.176 kg) IBW/kg (Calculated) : 43.2 Heparin Dosing Weight:   Vital Signs: Temp: 97.6 F (36.4 C) (07/22 0915) Temp Source: Axillary (07/22 0915) BP: 126/83 mmHg (07/22 0915) Pulse Rate: 79 (07/22 0915)  Labs:  Recent Labs  10/17/14 0945 10/17/14 1019 10/18/14 0447 10/19/14 0555  HGB 10.6*  --   --  10.0*  HCT 34.6*  --   --  33.0*  PLT 103*  --   --  116*  LABPROT 38.6*  --  33.3* 24.7*  INR 4.08*  --  3.35* 2.26*  CREATININE  --  2.52* 2.31* 2.29*  TROPONINI  --  0.05*  --   --     Estimated Creatinine Clearance: 10.2 mL/min (by C-G formula based on Cr of 2.29).   Medications:  Scheduled:  . feeding supplement (ENSURE ENLIVE)  237 mL Oral TID BM  . pantoprazole  40 mg Oral Daily  . polyethylene glycol  17 g Oral BID  . senna-docusate  3 tablet Oral BID  . sodium chloride  3 mL Intravenous Q12H  . Warfarin - Pharmacist Dosing Inpatient   Does not apply q1800    Assessment: 81yo female on Coumadin for AFib, having presented with supra-therapeutic INR likely due to poor po intake & Vit K deficiency.  INR corrected today at 2.26.  No bleeding noted.  Family wishes to continue COumadin.  Goal of Therapy:  INR 2-3 Monitor platelets by anticoagulation protocol: Yes   Plan:  Coumadin 2mg  today Daily INR  Gracy Bruins, PharmD [facility]

## 2014-10-19 NOTE — Care Management Note (Signed)
Case Management Note  Patient Details  Name: [name] MRN: [id number] Date of Birth: 23-Jun-1917  Subjective/Objective:              CM following for progression and d/c planning.      Action/Plan: CSW , Shelle Iron working with family to place pt at [facility] for residential hospice care.   Expected Discharge Date:    10/19/2014              Expected Discharge Plan:  [place]  In-House Referral:  Clinical Social Work  Discharge planning Services  NA  Post Acute Care Choice:  NA Choice offered to:  NA  DME Arranged:    DME Agency:     HH Arranged:    [place] Agency:     Status of Service:  Completed, signed off  Medicare Important Message Given:    Date Medicare IM Given:    Medicare IM give by:    Date Additional Medicare IM Given:    Additional Medicare Important Message give by:     If discussed at [place] of Stay Meetings, dates discussed:    Additional Comments:  Adron Bene, RN 10/19/2014, 12:28 PM

## 2014-10-19 NOTE — Discharge Summary (Signed)
Physician Discharge Summary  [name] MRN: [id number] DOB/AGE: 09/17/1917 81 y.o.  PCP: Stephens Shire, MD   Admit date: 10/17/2014 Discharge date: 10/19/2014  Discharge Diagnoses:     Principal Problem:   AKI (acute kidney injury) Active Problems:   Cervical spondylosis without myelopathy   Chronic pain of multiple joints   Aortic stenosis, severe   Bradycardia   Oliguria   Sacral ulcer   Acute kidney failure   Palliative care encounter   Protein-calorie malnutrition, severe    Follow-up recommendations  Patient will be discharged to [facility] if bed available today      Medication List    STOP taking these medications        carvedilol 6.25 MG tablet  Commonly known as:  COREG     COLCRYS 0.6 MG tablet  Generic drug:  colchicine     diltiazem 120 MG 24 hr capsule  Commonly known as:  CARDIZEM CD     losartan 100 MG tablet  Commonly known as:  COZAAR     warfarin 2 MG tablet  Commonly known as:  COUMADIN      TAKE these medications        feeding supplement (ENSURE ENLIVE) Liqd  Take 237 mLs by mouth 3 (three) times daily between meals.     food thickener Powd  Commonly known as:  SIMPLYTHICK  Take by mouth as needed.     furosemide 40 MG tablet  Commonly known as:  LASIX  Take 40 mg by mouth daily.     LORazepam 2 MG/ML concentrated solution  Commonly known as:  ATIVAN  Take 0.5 mLs (1 mg total) by mouth every 6 (six) hours as needed for anxiety.     morphine CONCENTRATE 10 MG/0.5ML Soln concentrated solution  Take 0.25 mLs (5 mg total) by mouth every 2 (two) hours as needed for severe pain.     ondansetron 4 MG tablet  Commonly known as:  ZOFRAN  Take 1 tablet (4 mg total) by mouth every 6 (six) hours as needed for nausea.     oxyCODONE-acetaminophen 5-325 MG per tablet  Commonly known as:  PERCOCET/ROXICET  Take 1 tablet by mouth every 4 (four) hours as needed (not more than 5 per day).     pantoprazole 40 MG tablet   Commonly known as:  PROTONIX  Take 40 mg by mouth 2 (two) times daily.     polyethylene glycol packet  Commonly known as:  MIRALAX / GLYCOLAX  Take 17 g by mouth 2 (two) times daily.     SENNA PLUS 8.6-50 MG per tablet  Generic drug:  senna-docusate  Take 2-3 tablets by mouth 2 (two) times daily. 3 tablets in the morning and 2 tablets in the evening         Discharge Condition: Prognosis poor   Disposition: [facility]   Consults: Palliative care  Significant Diagnostic Studies:  Ct Head Wo Contrast  10/17/2014   CLINICAL DATA:  Weakness, unable to eat  EXAM: CT HEAD WITHOUT CONTRAST  TECHNIQUE: Contiguous axial images were obtained from the base of the skull through the vertex without intravenous contrast.  COMPARISON:  07/05/2009  FINDINGS: No skull fracture is noted. Paranasal sinuses and mastoid air cells are unremarkable. Moderate cerebral atrophy. Mild periventricular white matter decreased attenuation probable due to chronic small vessel ischemic changes. Atherosclerotic calcification of carotid siphon. No acute cortical infarction. No mass lesion is noted on this unenhanced scan.  IMPRESSION: No acute  intracranial abnormality. Moderate cerebral atrophy. Mild periventricular white matter decreased attenuation probable due to chronic small vessel ischemic changes. No definite acute cortical infarction.   Electronically Signed   By: Lahoma Crocker M.D.   On: 10/17/2014 10:08   Dg Abd Acute W/chest  10/17/2014   CLINICAL DATA:  Abdominal pain and weakness  EXAM: DG ABDOMEN ACUTE W/ 1V CHEST  COMPARISON:  Abdominal radiograph June 19, 2010; chest radiograph April 23 04/19/2011  FINDINGS: PA chest: There is no edema or consolidation. Heart is mildly enlarged with pulmonary vascularity within normal limits. Prominence the right peritracheal region most likely represents great vessel prominence in this age group. The aorta is prominent and tortuous with extensive atherosclerotic  calcification, stable. There is extensive arthropathy in both shoulders with chronic avascular necrosis in each humeral head.  Supine and left lateral decubitus abdomen: There is diffuse stool throughout the colon. There is no bowel dilatation or air-fluid level suggesting obstruction. No free air. Extensive arterial vascular calcification present. There is lumbar scoliosis with degenerative type change. Bones are diffusely osteoporotic.  IMPRESSION: Diffuse stool throughout colon. Bowel gas pattern unremarkable. No obstruction or free air. No lung edema or consolidation. Stable cardiomegaly. Extensive atherosclerotic calcification. 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   Electronically Signed   By: Lowella Grip III M.D.   On: 10/17/2014 10:18       Filed Weights   10/17/14 2224  Weight: 46.176 kg (101 lb 12.8 oz)      Labs: Results for orders placed or performed during the hospital encounter of 10/17/14 (from the past 48 hour(s))  Basic metabolic panel     Status: Abnormal   Collection Time: 10/18/14  4:47 AM  Result Value Ref Range   Sodium 140 135 - 145 mmol/L   Potassium 4.4 3.5 - 5.1 mmol/L   Chloride 100 (L) 101 - 111 mmol/L   CO2 32 22 - 32 mmol/L   Glucose, Bld 99 65 - 99 mg/dL   BUN 93 (H) 6 - 20 mg/dL   Creatinine, Ser 2.31 (H) 0.44 - 1.00 mg/dL   Calcium 8.8 (L) 8.9 - 10.3 mg/dL   GFR calc non Af Amer 17 (L) >60 mL/min   GFR calc Af Amer 20 (L) >60 mL/min    Comment: (NOTE) The eGFR has been calculated using the CKD EPI equation. This calculation has not been validated in all clinical situations. eGFR's persistently <60 mL/min signify possible Chronic Kidney Disease.    Anion gap 8 5 - 15  Protime-INR     Status: Abnormal   Collection Time: 10/18/14  4:47 AM  Result Value Ref Range   Prothrombin Time 33.3 (H) 11.6 - 15.2 seconds   INR 3.35 (H) 0.00 - 1.49  TSH     Status: None   Collection Time: 10/18/14  1:30 PM  Result Value Ref Range   TSH 0.791  0.350 - 4.500 uIU/mL  T4, free     Status: None   Collection Time: 10/18/14  1:30 PM  Result Value Ref Range   Free T4 0.90 0.61 - 1.12 ng/dL  Protime-INR     Status: Abnormal   Collection Time: 10/19/14  5:55 AM  Result Value Ref Range   Prothrombin Time 24.7 (H) 11.6 - 15.2 seconds   INR 2.26 (H) 0.00 - 1.49  Comprehensive metabolic panel     Status: Abnormal   Collection Time: 10/19/14  5:55 AM  Result Value Ref Range   Sodium 138 135 -  145 mmol/L   Potassium 4.7 3.5 - 5.1 mmol/L   Chloride 98 (L) 101 - 111 mmol/L   CO2 31 22 - 32 mmol/L   Glucose, Bld 104 (H) 65 - 99 mg/dL   BUN 93 (H) 6 - 20 mg/dL   Creatinine, Ser 2.29 (H) 0.44 - 1.00 mg/dL   Calcium 8.7 (L) 8.9 - 10.3 mg/dL   Total Protein 5.0 (L) 6.5 - 8.1 g/dL   Albumin 2.5 (L) 3.5 - 5.0 g/dL   AST 12 (L) 15 - 41 U/L   ALT 11 (L) 14 - 54 U/L   Alkaline Phosphatase 55 38 - 126 U/L   Total Bilirubin 0.5 0.3 - 1.2 mg/dL   GFR calc non Af Amer 17 (L) >60 mL/min   GFR calc Af Amer 20 (L) >60 mL/min    Comment: (NOTE) The eGFR has been calculated using the CKD EPI equation. This calculation has not been validated in all clinical situations. eGFR's persistently <60 mL/min signify possible Chronic Kidney Disease.    Anion gap 9 5 - 15  CBC     Status: Abnormal   Collection Time: 10/19/14  5:55 AM  Result Value Ref Range   WBC 5.1 4.0 - 10.5 K/uL   RBC 3.81 (L) 3.87 - 5.11 MIL/uL   Hemoglobin 10.0 (L) 12.0 - 15.0 g/dL   HCT 33.0 (L) 36.0 - 46.0 %   MCV 86.6 78.0 - 100.0 fL   MCH 26.2 26.0 - 34.0 pg   MCHC 30.3 30.0 - 36.0 g/dL   RDW 17.3 (H) 11.5 - 15.5 %   Platelets 116 (L) 150 - 400 K/uL    Comment: CONSISTENT WITH PREVIOUS RESULT     Lipid Panel  No results found for: CHOL, TRIG, HDL, CHOLHDL, VLDL, LDLCALC, LDLDIRECT   No results found for: HGBA1C   Lab Results  Component Value Date   CREATININE 2.29* 10/19/2014     HPI :*81 y.o. female, with atrial fibrillation, hypertension, and severe aortic  stenosis. She presents to the emergency department today. After 3 days of not eating or drinking. She has stayed in bed and rarely spoken. Her daughter reports that she has not urinated for several days. She discussed this with her PCP yesterday who advised giving the patient 40 mg of Lasix orally. When the patient did urinate it was dark brown. The daughter reports the patient has barely eaten. Food appears to nauseate her. The patient now speaks very little. She appears to be shutting down.  In the emergency department the patient was found to have a significantly elevated BUN and creatinine. She has significant stool burden on x-ray. She is somnolent. I discussed the natural process of dying with her daughter. Her daughter understands but wants her mother to receive IV fluids and treatment at this point. The daughter and great granddaughter indicate that they believe the patient would not want to return to the hospital after this admission. They are in agreement with engaging hospice   HOSPITAL COURSE:     Acute kidney injury in the setting of stage 3-4 chronic kidney disease. Creatinine has slowly trended up over the years, suspected that baseline is around 2.0 The last recorded BUN/creatinine in Epic was in 2012 (40/1.61). Some improvement after hydration overnight  She is not eating/drinking, getting out of bed or talking much at all. Discontinued nephrotoxic medications such as Lasix, Cozaar, Colcrys.  Failure to thrive Multifactorial secondary to comorbidities including severe aortic stenosis History of dysphagia, speech therapy  recommends Dysphagia 2 (Fine chop);Nectar thick  Atrial fibrillation with Bradycardia currently (36-58). In the setting of severe aortic stenosis Patient regularly sees Dr. Wynonia Lawman -admitting provider called him for any recommendations. He reports she is essentially bed bound with severe chronic pain and has a DNR order. He recommends stopping the Toprol  and Diltiazem, but no further work up for AS or Bradycardia would be appropriate given her age and status. Discontinue Coumadin, patient is now being discharged to [facility] Discussed with the family that the patient will not have any INR monitoring and therefore the Coumadin needs to be discontinued  Constipation Patient has been started on Senokot, miralax  You also need to rule out impaction  Urinary retention Resolved   Palliative care goals  DNR  Would not pursue HD in setting of worsening renal function  Would not pursue feeding tube  Does not want her to die in the home  Transfer to [facility] of bed available   Sacral Ulceration Wound consultation requested.  Chronic pain Continue oral pain medications. When necessary Roxanol and Ativan for comfort    Discharge Exam:    Blood pressure 126/83, pulse 79, temperature 97.6 F (36.4 C), temperature source Axillary, resp. rate 16, height _0  (1.499 m), weight 46.176 kg (101 lb 12.8 oz), SpO2 99 %.  Gen: Alert and oriented to self, NAD, frail and debilitated HEENT: neck supple, no JVD CVS: S1 and S2 clear, 3-4/6 SEM Chest: clear to auscultation bilaterally, no wheezing rales or rhonchi Abdomen: Soft, nontender,distended, normal bowel sounds Extremities: No cyanosis clubbing or edema noted Neuro: Cranial nerves II-12 intact, strength 5/5 in upper and lower extremeties bilaterally Skin: No rashes Psych: Alert and oriented to self normal affect        Discharge Instructions    Diet - low sodium heart healthy    Complete by:  As directed      Increase activity slowly    Complete by:  As directed              Signed: Joseh Sjogren 10/19/2014, 10:59 AM        Time spent >45 mins

## 2014-10-19 NOTE — Progress Notes (Signed)
OT Cancellation Note  Patient Details Name: [name] MRN: [id number] DOB: December 10, 1917   Cancelled Treatment:    Reason Eval/Treat Not Completed: OT screened, no needs identified, will sign off. Pt to discharge to inpatient hospice when bed available.    Malka So 10/19/2014, 11:16 AM

## 2014-10-24 ENCOUNTER — Telehealth: Payer: Self-pay | Admitting: *Deleted

## 2014-10-24 NOTE — Telephone Encounter (Signed)
[name]'s daughter [name] called to inform us that they have called hospice in for [place].  She has stopped eating and health is trending downward.  She will not be returning.

## 2014-10-25 NOTE — Telephone Encounter (Signed)
Ok sorry to hear

## 2014-10-29 ENCOUNTER — Encounter: Payer: Medicare Other | Admitting: Registered Nurse

## 2014-11-29 DEATH — deceased
